# Patient Record
Sex: Male | Born: 1990 | Race: White | Hispanic: No | Marital: Single | Smoking: Current some day smoker
Health system: Southern US, Community
[De-identification: ages and names within clinical notes are randomized; demographics above are authoritative.]

## PROBLEM LIST (undated history)

## (undated) DIAGNOSIS — F32A Depression, unspecified: Secondary | ICD-10-CM

## (undated) DIAGNOSIS — K5792 Diverticulitis of intestine, part unspecified, without perforation or abscess without bleeding: Secondary | ICD-10-CM

## (undated) DIAGNOSIS — R4689 Other symptoms and signs involving appearance and behavior: Secondary | ICD-10-CM

## (undated) DIAGNOSIS — R4589 Other symptoms and signs involving emotional state: Secondary | ICD-10-CM

## (undated) DIAGNOSIS — F329 Major depressive disorder, single episode, unspecified: Secondary | ICD-10-CM

---
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
## 2016-12-29 ENCOUNTER — Encounter (HOSPITAL_COMMUNITY): Payer: Self-pay

## 2016-12-29 ENCOUNTER — Emergency Department (HOSPITAL_COMMUNITY)
Admission: EM | Admit: 2016-12-29 | Discharge: 2017-01-07 | Disposition: A | Discharge status: Home / Self Care | Payer: Self-pay | Attending: Emergency Medicine | Admitting: Emergency Medicine

## 2016-12-29 DIAGNOSIS — R45851 Suicidal ideations: Secondary | ICD-10-CM | POA: Insufficient documentation

## 2016-12-29 DIAGNOSIS — F329 Major depressive disorder, single episode, unspecified: Secondary | ICD-10-CM | POA: Insufficient documentation

## 2016-12-29 DIAGNOSIS — F17200 Nicotine dependence, unspecified, uncomplicated: Secondary | ICD-10-CM | POA: Insufficient documentation

## 2016-12-29 HISTORY — DX: Depression, unspecified: F32.A

## 2016-12-29 HISTORY — DX: Major depressive disorder, single episode, unspecified: F32.9

## 2016-12-29 HISTORY — DX: Other symptoms and signs involving appearance and behavior: R46.89

## 2016-12-29 HISTORY — DX: Other symptoms and signs involving emotional state: R45.89

## 2016-12-29 LAB — COMPREHENSIVE METABOLIC PANEL
ALT: 25 U/L (ref 17–63)
ANION GAP: 13 (ref 5–15)
AST: 23 U/L (ref 15–41)
Albumin: 4.4 g/dL (ref 3.5–5.0)
Alkaline Phosphatase: 88 U/L (ref 38–126)
BILIRUBIN TOTAL: 0.7 mg/dL (ref 0.3–1.2)
BUN: 10 mg/dL (ref 6–20)
CHLORIDE: 99 mmol/L — AB (ref 101–111)
CO2: 25 mmol/L (ref 22–32)
Calcium: 9.9 mg/dL (ref 8.9–10.3)
Creatinine, Ser: 0.85 mg/dL (ref 0.61–1.24)
Glucose, Bld: 88 mg/dL (ref 65–99)
POTASSIUM: 3.7 mmol/L (ref 3.5–5.1)
Sodium: 137 mmol/L (ref 135–145)
TOTAL PROTEIN: 8.3 g/dL — AB (ref 6.5–8.1)

## 2016-12-29 LAB — RAPID URINE DRUG SCREEN, HOSP PERFORMED
AMPHETAMINES: NOT DETECTED
BENZODIAZEPINES: NOT DETECTED
Barbiturates: NOT DETECTED
Cocaine: NOT DETECTED
Opiates: NOT DETECTED
TETRAHYDROCANNABINOL: NOT DETECTED

## 2016-12-29 LAB — CBC
HEMATOCRIT: 46.7 % (ref 39.0–52.0)
HEMOGLOBIN: 16.1 g/dL (ref 13.0–17.0)
MCH: 30.4 pg (ref 26.0–34.0)
MCHC: 34.5 g/dL (ref 30.0–36.0)
MCV: 88.1 fL (ref 78.0–100.0)
PLATELETS: 372 10*3/uL (ref 150–400)
RBC: 5.3 MIL/uL (ref 4.22–5.81)
RDW: 13.5 % (ref 11.5–15.5)
WBC: 12 10*3/uL — AB (ref 4.0–10.5)

## 2016-12-29 LAB — ACETAMINOPHEN LEVEL: Acetaminophen (Tylenol), Serum: <10 ug/mL — ABNORMAL LOW (ref 10–30)

## 2016-12-29 LAB — SALICYLATE LEVEL

## 2016-12-29 LAB — ETHANOL

## 2016-12-29 MED ORDER — ALUM & MAG HYDROXIDE-SIMETH 200-200-20 MG/5ML PO SUSP
30.0000 mL | Freq: Four times a day (QID) | ORAL | Status: DC | PRN
  Administered 2017-01-03 – 2017-01-05 (×2): 30 mL via ORAL
  Filled 2016-12-29 (×2): qty 30

## 2016-12-29 MED ORDER — ACETAMINOPHEN 325 MG PO TABS: 650.0000 mg | ORAL_TABLET | ORAL | Status: DC | PRN

## 2016-12-29 MED ORDER — ONDANSETRON HCL 4 MG PO TABS: 4.0000 mg | ORAL_TABLET | Freq: Three times a day (TID) | ORAL | Status: DC | PRN

## 2016-12-29 NOTE — ED Provider Notes (Signed)
[facility]-EMERGENCY DEPT Provider Note   CSN: [id number] Arrival date & time: 12/29/16  1529     History   Chief Complaint Chief Complaint  Patient presents with  . Suicidal    HPI Theresia Loyler XXXDavis is a 26 y.o. male.  26yo M w/ PMH including CP who p/w depression and SI. Pt reports a long hx of depression for which he has seen a counselor in the past but is not currently undergoing any treatment. He states the depression has been worse over the past 2 weeks. 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 He states that a few hours ago he became suicidal. He states " I have been working on a plan" but he refuses to tell me any details. He states he drank alcohol heavily yesterday but is not a daily drinker. He denies drug use. He reports homicidal thoughts towards his father.  Pt reports recent R lower tooth pain for which he completed a course of antibiotics 1 week ago. He has continued to have tooth pain. He has not followed up with a dentist.   The history is provided by the patient.    Past Medical History:  Diagnosis Date  . Depression     There are no active problems to display for this patient.   History reviewed. No pertinent surgical history.     Home Medications    Prior to Admission medications   Medication Sig Start Date End Date Taking? Authorizing Provider  ibuprofen (ADVIL,MOTRIN) 600 MG tablet Take 600 mg by mouth daily as needed. 12/08/16  Yes [provider]    Family History History reviewed. No pertinent family history.  Social History Social History  Substance Use Topics  . Smoking status: Current Some Day Smoker  . Smokeless tobacco: Never Used  . Alcohol use Yes     Allergies   Patient has no known allergies.   Review of Systems Review of Systems All other systems reviewed and are negative except that which was mentioned in HPI   Physical Exam Updated Vital Signs BP (!)  176/118 (BP Location: Right Arm)   Pulse (!) 107   Temp 98.6 F (37 C) (Oral)   Resp 18   Wt 101.7 kg (224 lb 1.6 oz)   SpO2 96%   Physical Exam  Constitutional: He is oriented to person, place, and time. He appears well-developed and well-nourished. No distress.  HENT:  Head: Normocephalic and atraumatic.  Mouth/Throat: Oropharynx is clear and moist.  Broken R lower molar with no drainage, gumline swelling, or facial swelling  Eyes: Conjunctivae are normal.  Neck: Neck supple.  Cardiovascular: Normal rate, regular rhythm and normal heart sounds.   No murmur heard. Pulmonary/Chest: Effort normal and breath sounds normal. No respiratory distress.  Neurological: He is alert and oriented to person, place, and time.  Skin: Skin is warm and dry.  Psychiatric:  Avoids eye contact, depressed mood, slightly angry during conversation  Nursing note and vitals reviewed.    ED Treatments / Results  Labs (all labs ordered are listed, but only abnormal results are displayed) Labs Reviewed  COMPREHENSIVE METABOLIC PANEL - Abnormal; Notable for the following:       Result Value   Chloride 99 (*)    Total Protein 8.3 (*)    All other components within normal limits  CBC - Abnormal; Notable for the following:    WBC 12.0 (*)    All other components  within normal limits  ETHANOL  SALICYLATE LEVEL  ACETAMINOPHEN LEVEL  RAPID URINE DRUG SCREEN, HOSP PERFORMED    EKG  EKG Interpretation None       Radiology No results found.  Procedures Procedures (including critical care time)  Medications Ordered in ED Medications  acetaminophen (TYLENOL) tablet 650 mg (not administered)  ondansetron (ZOFRAN) tablet 4 mg (not administered)  alum & mag hydroxide-simeth (MAALOX/MYLANTA) 200-200-20 MG/5ML suspension 30 mL (not administered)     Initial Impression / Assessment and Plan / ED Course  I have reviewed the triage vital signs and the nursing notes.  Pertinent labs that were  available during my care of the patient were reviewed by me and considered in my medical decision making (see chart for details).     PT w/ h/o depression p/w worsening depression and SI today. He was hypertensive but otherwise stable VS on exam. He would not tell me his plan but states he has been working on one. I examined tooth where he has cavity and partially broken molar. He does not have any surrounding swelling and given that he just completed antibiotics I do not feel he needs any further antibiotics at this time, rather he needs to see a dentist as soon as possible to have this definitively managed. I have discussed this with him. Contacted TTS as I am concerned about his worsening depression.  6:16 PM Pt's labs unremarkable. Pt medically clear for psychiatric evaluation. His disposition will be determined by psychiatry team recommendations. He is currently here voluntarily but given his significant threats for self-harm, I will complete IVC papers if he tries to leave. Final Clinical Impressions(s) / ED Diagnoses   Final diagnoses:  None    New Prescriptions New Prescriptions   No medications on file     Skarleth Delmonico, Ambrose Finland, MD 12/29/16 1932
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
## 2016-12-29 NOTE — ED Notes (Signed)
TTS in progress 
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
## 2016-12-29 NOTE — ED Notes (Signed)
Dinner tray ordered.

## 2016-12-29 NOTE — ED Notes (Signed)
Pt ambulatory to restroom, pt instructed he must leave door cracked

## 2016-12-29 NOTE — ED Notes (Signed)
Writer was called by the NT sitter to help with the patient, writer observed pt having a spoon in his hands with sitter holding on to patients hand and spoon.  Sitter stated that he was trying to put the spoon down his throat to hurt himself.  Radio broadcast assistantWriter assisted sitter with removing the spoon from patient.  Teacher, early years/preWriter and sitter removed table, cups, and utensils from pt's room.  Environmental managerWriter notified RN.

## 2016-12-29 NOTE — ED Notes (Signed)
Pt states he has a very serious plan to harm himself. Also states, "I'm not telling anyone". EDP aware.

## 2016-12-29 NOTE — ED Notes (Signed)
Pt is calm and cooperative with this RN. Ensured belongings in [place] and valuables with security. Pt also signed "what to expect sheet" pt resting comfortably in bed, NAD at this time. Denies any needs.

## 2016-12-29 NOTE — ED Notes (Signed)
TTS at bedside. 

## 2016-12-29 NOTE — ED Notes (Signed)
Pt making phone call at nurses station 

## 2016-12-29 NOTE — ED Triage Notes (Addendum)
Pt state he "wants to die." Pt states he has had problems with his family and he "feels like no one wants him around." Pt states heavy alcohol use yesterday. Pt also states he has homicidal ideation towards his father.  Pt denies drug use X2 months. He is alert and oriented, cooperative during triage.

## 2016-12-29 NOTE — ED Notes (Addendum)
Pt shredding paper, noted to be chewing on something, when asked to open his mouth, pt swallowed, nothing in mouth at this time. EDP aware. Security and [employer] at the bedside.

## 2016-12-29 NOTE — ED Notes (Signed)
This tech went in triage room to place all of pt belongings into white belongings bag when pt was missing a shoelace on one shoe but not the other. I asked pt if he knew where the other shoelace was he said "no i didn't know where it went when I left the house I had them both on". This tech then noticed the shoelace in pt hands, this tech asked pt repeatedly  to give the shoelace to this tech he said "no this is the only thing I have to kill myself with, you will have to pry it out of my hand" I told pt that he needed to give the shoelace up that we would need to call security if he didn't, pt then said "well call them cause im not letting go of it." Security was call and pt handed shoelace over to security. Shoelace was then put in pt belongings bag.

## 2016-12-29 NOTE — ED Notes (Signed)
Pt belongings are in [place] in locker #1. Pt also has valuables that were given to security.

## 2016-12-30 MED ORDER — HALOPERIDOL LACTATE 5 MG/ML IJ SOLN
5.0000 mg | Freq: Once | INTRAMUSCULAR | Status: AC
  Administered 2016-12-30: 5 mg via INTRAMUSCULAR
  Filled 2016-12-30: qty 1

## 2016-12-30 MED ORDER — LORAZEPAM 2 MG/ML IJ SOLN
1.0000 mg | Freq: Once | INTRAMUSCULAR | Status: AC
  Administered 2016-12-30: 1 mg via INTRAMUSCULAR
  Filled 2016-12-30: qty 1

## 2016-12-30 MED ORDER — DIPHENHYDRAMINE HCL 50 MG/ML IJ SOLN
25.0000 mg | Freq: Once | INTRAMUSCULAR | Status: AC
  Administered 2016-12-30: 25 mg via INTRAMUSCULAR
  Filled 2016-12-30: qty 1

## 2016-12-30 NOTE — ED Triage Notes (Addendum)
Pt is sitting on floor and reported he had a plan to hit his head against the wall and it would only  Take 30 sec.  Pt did not finish  the sentence.

## 2016-12-30 NOTE — ED Triage Notes (Signed)
Called to PT room by NT Sitter. Pt had had around his neck trying to choke self.

## 2016-12-30 NOTE — ED Notes (Signed)
Pt care assumed, pt is resting comfortably with eyes closed.  Rise and fall of chest noted.  Sitter at bedside.

## 2016-12-30 NOTE — ED Triage Notes (Signed)
PT grabbed spoon and broke spoon and reported he was going to use it.

## 2016-12-30 NOTE — ED Triage Notes (Signed)
TC to DR Erma HeritageIsaacs  To request medication to help pt relax.

## 2016-12-30 NOTE — ED Triage Notes (Signed)
Pt to BR  With sitter escort . Pt in direct sight of sitter while in BR.

## 2016-12-30 NOTE — ED Triage Notes (Signed)
PT sitting up in bed eating meal. Pt reported he felt better after he slept.

## 2016-12-30 NOTE — ED Triage Notes (Signed)
NT reported the Pt told her he was getting ready to hang himself with the call bell cord. Plan to remove the call bell from room. Pt also reported he will hang himself if he takes a shower .

## 2016-12-30 NOTE — ED Provider Notes (Signed)
Patient attempting to harm himself via broken utensils, which security has confiscated. Pt continues to try to harm himself, and remains actively suicidal. Given high risk of danger to himself with refusal to allow security to search him with active attempts to harm self, IVC placed. Nursing aware.   Shaune PollackIsaacs, Royanne Warshaw, MD 12/30/16 [phone]

## 2016-12-30 NOTE — ED Notes (Signed)
Regular Diet Ordered for Dinner. 

## 2016-12-30 NOTE — ED Triage Notes (Signed)
Pt has a 24 HR hold ,Emergency Commitment  Until  IVC papers arrive.

## 2016-12-30 NOTE — BH Assessment (Signed)
This clinician evaluated the patient who reports he just tried to kill himself with a broken plastic spoon to his knife. When asked how this would harm him he stated he would "dig deep enough." According to the patient's nurse has made two verbal threats today: to choke self with a call bell cord or would find something in the shower to hang self.  The patient did attempt to put a spoon down his throat and bang his head on the wall. The patient has now been IVC'd. Continues to endorse SI and HI. States he is homicidal toward his father would not offer any information about his father. Reluctantly admitted his mother, Adele SchilderDiana Gural, is his emergency contact.    The patient was raised by his mother. Report only having his father in his life for 10 yrs. State his father chose alcohol over him so was not engaged for a long time. States he has been told he was a mistake his whole life. Was living with Grandmother most recently but indicated as of yesterday he is now homeless. "She doesn't want me anymore."  Reports finishing the 12th grade. Denies DD or IEP assistance in school.   Patient repeatedly stated he didn't want anyone to know he was at the hospital. Admittedly came to the ER voluntarily seeking help. Now states, "I will jump out of the car if someone tries to take me somewhere." This clinician assured the patient authorities would be made aware in order to take precautions. Patient understands he is IVC'd.    The patient continues to meet inpatient criteria.

## 2016-12-30 NOTE — ED Triage Notes (Signed)
PT refuses to return to bed.

## 2016-12-30 NOTE — Progress Notes (Signed)
Patient meets criteria for inpatient treatment. CSW faxed referrals to the following inpatient facilities for review:  [facility], [facility], [facility], [facility], [facility], [facility], [facility], [address] [facility] [facility], [facility], [facility], [facility]   TTS will continue to seek bed placement.  Baldo DaubJolan Zania Kalisz MSW, LCSWA CSW Disposition [phone]

## 2016-12-31 MED ORDER — IBUPROFEN 400 MG PO TABS
600.0000 mg | ORAL_TABLET | Freq: Three times a day (TID) | ORAL | Status: DC | PRN
Start: 2016-12-31 — End: 2017-01-07
  Administered 2016-12-31 – 2017-01-03 (×5): 600 mg via ORAL
  Filled 2016-12-31 (×5): qty 1

## 2016-12-31 NOTE — BH Assessment (Signed)
BHH Assessment Progress Note  Pt reassessed this morning. Pt continues to endorse SI and HI (towards his dad). Pt reports never having an IP psych hospitalization nor ever taking any psychiatric meds. Pt reports feeling suicidal since he was 26 years old. Pt reports being currently homeless, last living with his grandmother for a couple of days outside of [place]. Before this, pt reports living in [place] for a couple of days. Prior to this, pt reports living in [place] for over a year with his fiance until she broke up with him. IP treatment still recommended for pt.    Johny ShockSamantha M. Ladona Ridgelaylor, MS, NCC, LPCA Counselor

## 2016-12-31 NOTE — ED Notes (Signed)
Pt provider evening snack.

## 2016-12-31 NOTE — ED Notes (Signed)
Call bell noted to be removed from room. Per report, pt is to have no silverware.

## 2016-12-31 NOTE — ED Notes (Signed)
Pt ambulatory to shower w/Sitter. Pt voices

## 2016-12-31 NOTE — ED Notes (Signed)
Regular Diet was ordered for Dinner. 

## 2016-12-31 NOTE — Progress Notes (Signed)
CSW spoke with David BasqueBecky, RN who has requested NP make medication recommendations for the pt or review the medication. David BasqueBecky, RN reports the pt verbalized to her today that he does feel better but he is still suicidal. Debbora PrestoJustina, NP notified of the request.  Princess BruinsAquicha Dadrian Ballantine, MSW, LCSWA TTS Specialist [phone]

## 2016-12-31 NOTE — ED Notes (Signed)
Pt asking when he will be accepted to a facility. Discussed w/pt how his behavior affects this - advised pt if he is calm, cooperative - RN's can document this which will help w/hopefully get accepted sooner. States he is aware of how he acted yesterday and states he will not display that behavior again. States he wants to go to a facility to get help. Pt has had call bell today w/o difficulty/issues. Pt also showered today w/o issues. Pt has been calm, cooperative, pleasant - apologized to staff who were here during his episode.

## 2016-12-31 NOTE — ED Notes (Signed)
Patient made a phone call.  

## 2016-12-31 NOTE — ED Notes (Signed)
Pt given call bell as asked after much discussion of appropriate use. Pt verbalized understanding and advised he will not use it in an attempt to hurt himself. Sitter to monitor pt closely.

## 2016-12-31 NOTE — ED Notes (Signed)
Patient was given cookies for snack.

## 2016-12-31 NOTE — ED Notes (Signed)
Pt up to bathroom.

## 2017-01-01 MED ORDER — ZOLPIDEM TARTRATE 5 MG PO TABS
5.0000 mg | ORAL_TABLET | Freq: Every evening | ORAL | Status: DC | PRN
  Administered 2017-01-01 – 2017-01-06 (×7): 5 mg via ORAL
  Filled 2017-01-01 (×7): qty 1

## 2017-01-01 NOTE — ED Notes (Signed)
Spoke with Bebe ShaggyWickline, MD about patient concern of difficulty falling asleep.

## 2017-01-01 NOTE — ED Notes (Signed)
MD notified about pt cp.

## 2017-01-01 NOTE — ED Notes (Signed)
PT ambulated to the bathroom, pt asking about pain medication and meds that were given last night to help him sleep.

## 2017-01-01 NOTE — BHH Counselor (Signed)
Reassessment- Pt reports SI/HI. Pt states he wants to harm his father. Pt reports AH.  Renata Capriceonrad, DNP continues to recommend inpatient.  Wolfgang PhoenixBrandi Alvine Mostafa, [facility] Triage Specialist

## 2017-01-01 NOTE — ED Notes (Signed)
Pt states that he feel numbness of the right side of his body. That he us unable to lift his arm or leg. But pt is now feeling better and able to move all extremeties at this time states he feel better and is able to walk with a steady gait to the bathroom and doesn't have any numbness or tingling.

## 2017-01-01 NOTE — ED Notes (Signed)
Pt ambulated to shower with sitter.  

## 2017-01-01 NOTE — ED Notes (Signed)
Sitting up in bed eating breakfast

## 2017-01-01 NOTE — ED Notes (Signed)
Pt c/o left knee pain from inactivity and old injuries-orders received -- knee sleeve placed on left knee.

## 2017-01-01 NOTE — ED Notes (Signed)
Pt stated that he was not going to do any acting out. Stated that he is only here to get the help that he needs.

## 2017-01-01 NOTE — ED Notes (Addendum)
Pt called out. States that he is unable to sleep here. Request something to help him sleep. MD contacted

## 2017-01-01 NOTE — Progress Notes (Signed)
CSW spoke with David Lamb at [facility] who states the pt's referral has not yet been reviewed. CSW will continue to monitor.  [name], MSW, LCSWA CSW Disposition [phone]

## 2017-01-01 NOTE — Progress Notes (Signed)
CSW spoke with Arline Aspindy at [facility] who states they do not have any adult beds at this time.  [facility] - Terri Burges, no answer, HIPPA compliant voicemail left with intake coordinator regarding bed status.  [facility] - Chelsea - states she does not have the referral but reports they are accepting patients and has 6 available beds at this time. CSW to fax referral to [facility].   Princess BruinsAquicha Daelan Gatt, MSW, LCSWA CSW Disposition [phone]

## 2017-01-02 MED ORDER — HALOPERIDOL 5 MG PO TABS: 5.0000 mg | ORAL_TABLET | Freq: Four times a day (QID) | ORAL | Status: DC | PRN

## 2017-01-02 MED ORDER — HALOPERIDOL LACTATE 5 MG/ML IJ SOLN: 5.0000 mg | Freq: Four times a day (QID) | INTRAMUSCULAR | Status: DC | PRN

## 2017-01-02 MED ORDER — HYDROXYZINE HCL 25 MG PO TABS
25.0000 mg | ORAL_TABLET | Freq: Four times a day (QID) | ORAL | Status: DC | PRN
  Administered 2017-01-03: 25 mg via ORAL
  Filled 2017-01-02: qty 1

## 2017-01-02 MED ORDER — LORAZEPAM 2 MG/ML IJ SOLN: 2.0000 mg | Freq: Four times a day (QID) | INTRAMUSCULAR | Status: DC | PRN

## 2017-01-02 MED ORDER — DIPHENHYDRAMINE HCL 50 MG/ML IJ SOLN: 25.0000 mg | Freq: Four times a day (QID) | INTRAMUSCULAR | Status: DC | PRN

## 2017-01-02 MED ORDER — LORAZEPAM 1 MG PO TABS: 2.0000 mg | ORAL_TABLET | Freq: Four times a day (QID) | ORAL | Status: DC | PRN

## 2017-01-02 MED ORDER — DIPHENHYDRAMINE HCL 25 MG PO CAPS
25.0000 mg | ORAL_CAPSULE | Freq: Four times a day (QID) | ORAL | Status: DC | PRN
  Administered 2017-01-03 – 2017-01-06 (×4): 25 mg via ORAL
  Filled 2017-01-02 (×4): qty 1

## 2017-01-02 NOTE — ED Notes (Signed)
Pt to desk to use phone, requesting the sheet of paper with phone numbers on it (it is on the clipboard).  Pt had short, pleasant conversation with his "bro," and informed him of the visiting hours and policies and that he still didn't know where he would be going.

## 2017-01-02 NOTE — ED Notes (Signed)
Patient requested that sitter wake him up at 0600 so he could "listen to music from 6-7 because it relaxes" him.  Patient now awake.

## 2017-01-02 NOTE — BH Assessment (Signed)
BHH Assessment Progress Note  Pt reassessed this afternoon. Pt continues to endorse SI, HI, and AVH. IP treatment continues to be recommended.    Johny ShockSamantha M. Ladona Ridgelaylor, MS, NCC, LPCA Counselor

## 2017-01-02 NOTE — ED Notes (Signed)
Pt given snack by sitter.  °

## 2017-01-02 NOTE — ED Notes (Signed)
Md seen pt stated that no further testing needed to be done.

## 2017-01-02 NOTE — ED Notes (Signed)
Patient is asleep at this time, NAD noted.

## 2017-01-02 NOTE — ED Notes (Signed)
Dinner tray ordered.

## 2017-01-02 NOTE — ED Notes (Signed)
Pt up to BR with even steady gait.

## 2017-01-03 ENCOUNTER — Other Ambulatory Visit: Payer: Self-pay | Admitting: Family

## 2017-01-03 NOTE — ED Notes (Signed)
Pt. oob to the front desk to make a phone call.  A paper with his numbers given to him.  Pt. Made a call but did not speak to anyone.  Pt.l was doing push-ups in his room.

## 2017-01-03 NOTE — ED Notes (Signed)
Pt was getting restless so this RN and Gena Fray walked Pt around the ED to stretch out legs.
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
## 2017-01-03 NOTE — ED Notes (Signed)
Pt states, "I will kill myself right outside the door if I get discharged. I will grab something and cut my neck. I will do it in the lobby in front of everybody if I have to. I don't want to go to anyplace in [place] either because my dad is the type of person that he will come and get to me if he has to no  Matter what he has to go through." this Rn encouraged the pt to remain calm and this RN will update on plan of care

## 2017-01-03 NOTE — Progress Notes (Signed)
Patient is being reviewed at [facility], per [place]. CSW in disposition will continue to follow up in the morning and seek inpatient treatment placement.  Melbourne Abts, MSW, LCSWA Clinical social worker in disposition [facility], TTS Office [phone] and [phone] 01/03/2017 8:27 PM

## 2017-01-03 NOTE — ED Notes (Signed)
Pt c/o of indigestion.

## 2017-01-04 MED ORDER — SERTRALINE HCL 50 MG PO TABS
25.0000 mg | ORAL_TABLET | Freq: Every day | ORAL | Status: DC
  Administered 2017-01-04 – 2017-01-07 (×4): 25 mg via ORAL
  Filled 2017-01-04 (×4): qty 1

## 2017-01-04 NOTE — ED Notes (Signed)
Pt called RN to room  - asking for [place] sandwich - advised pt he may have one at 1000 snack time - voiced understanding. Advised pt he may ask his Sitter his requests or ask him/her to notify RN. Voiced understanding.

## 2017-01-04 NOTE — ED Notes (Signed)
Re-TTS performed.  

## 2017-01-04 NOTE — ED Notes (Signed)
Pt threw spoon away - showed RN. Pt used spoon appropriately.

## 2017-01-04 NOTE — Plan of Care (Cosign Needed)
Consider initiating Zoloft for mood stabilization.  See consult notes for medication recommendation

## 2017-01-04 NOTE — ED Notes (Signed)
Pt voices upset d/t friend did not visit today as he had advised him he would do. Allowed pt to vent concerns/feelings.

## 2017-01-04 NOTE — ED Notes (Signed)
Pt ambulatory to nurses' desk asking if he is supposed to feel Zoloft helping him yet. Advised takes a couple of weeks for him to feel better. Voiced understanding.

## 2017-01-04 NOTE — ED Notes (Signed)
Pt attempted to make phone call - no answer. Pt returned to his room and is singing to country music videos.

## 2017-01-04 NOTE — ED Notes (Signed)
[place] sandwich and cranberry juice given for snack per pt request.

## 2017-01-04 NOTE — BH Assessment (Signed)
Patient reassessed on this date, 01-04-2017. Patient reports sleep disturbance the previous night of waking up intermittenly between 1700 and 0600.  He reports current SI with a plan to hang or cut himself.  Patient reports current HI with a plan to cut the throat of his Father who lives in [place], however he does not know the address.  Patient reports being estranged from all immediate family members who live in [place].  Patient rates depression level a "10" on a 10 point ascending scale.  He reports feeling "sad", "wanting to sleep all the time", "over eating", and "feeling tired all the time."  Patient reports denied experiencing VH, however reports an illusion of an "24 year old woman coming into my room and sitting by my bed" with no interaction or fear of her.    Patient continues to meet inpatient criteria and placement continue to be sought.

## 2017-01-04 NOTE — ED Notes (Signed)
Pt has a spoon so may eat ice cream - verbalized understanding of proper use - states "I'm not going to try to hurt myself again. I learned my lesson.

## 2017-01-04 NOTE — ED Notes (Signed)
Pt ambulating in hallway in [place] w/Sitter.

## 2017-01-04 NOTE — ED Notes (Signed)
Dinner Tray Ordered 

## 2017-01-04 NOTE — Progress Notes (Addendum)
Patient has been recommended inpatient treatment on 8/18 and has been referred out to the following hospitals: [facility] - per Tinnie Gens, no beds today. [facility] - this Clinical research associate left voicemail inquiring about status of referral. [facility] - per Silva Bandy, refax referral. Referral has been refaxed today. [facility] - per Aurea Graff, please call back on Monday when they have discharges. Referral was not found.  Declined at: [facility] - per [place].  At capacity: [facility], [facility], [facility] [place], [facility], [facility], [facility].  This Clinical research associate referred patient to [facility]. Authorization with [place], per licensed clinician [place]: [place] [id number] effective 01/04/17 through 01/10/17. Demographics have been given.  CSW in disposition will continue to follow up in efforts to secure placement for patient.  Melbourne Abts, MSW, LCSWA Clinical social worker in disposition [facility], TTS Office [phone] and [phone] 01/04/2017 12:29 PM

## 2017-01-05 NOTE — ED Notes (Signed)
Pt being monitored by RN.

## 2017-01-05 NOTE — ED Notes (Signed)
Standing at nurses' desk talking w/staff.
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
## 2017-01-05 NOTE — ED Notes (Signed)
Pt returned cards, pencil, and paper to RN. States he became bored w/attempt to write his songs.

## 2017-01-05 NOTE — ED Notes (Signed)
Sandwich and Sprite given for snack as requested.

## 2017-01-05 NOTE — ED Provider Notes (Signed)
Patient continues to endorse ongoing thoughts of suicide and homicidal thoughts. He states "this is not going to go away until I get treatment". Patient is alert and oriented. He is cooperative. No acute distress. Heart and lungs normal. Lower extremities normal without peripheral edema swelling or erythema. All movements are coordinated purposeful symmetric. Skin is warm and dry. IVC paperwork renewed for ongoing plan of placement for ongoing homicidal and suicidal thoughts.   Arby Barrette, MD 01/05/17 [phone]

## 2017-01-05 NOTE — Progress Notes (Signed)
Patient is on the [facility] waitlist, per intake [place].  Melbourne Abts, MSW, LCSWA Clinical social worker in disposition [facility], TTS Office [phone] and [phone]

## 2017-01-05 NOTE — ED Notes (Signed)
Magistrate received pt's IVC papers - will send officer to serve.

## 2017-01-05 NOTE — ED Notes (Signed)
Pt c/o indigestion - Maalox given.

## 2017-01-05 NOTE — ED Notes (Signed)
Pt given playing cards and pencil and paper so may write songs. Pt voiced understanding of appropriate use and states he will not attempt to hurt himself w/them.

## 2017-01-05 NOTE — ED Notes (Signed)
Pt showed RN he threw spoon away at breakfast and lunch - pt has been using spoon appropriately.

## 2017-01-05 NOTE — [facility] Counselor (Signed)
Client presented to [facility] on 12/29/16 with complaint of suicidal ideation and depressive symptoms as well as alcohol use.  Pt was re-assessed this morning.  He continues to endorse suicidal ideation with plan.  Recommend continued inpatient placement.

## 2017-01-05 NOTE — ED Notes (Signed)
Dr Donnald Garre assessed pt - renewing IVC papers.

## 2017-01-05 NOTE — ED Notes (Signed)
Pt ambulatory to shower w/Sitter. 

## 2017-01-05 NOTE — ED Notes (Signed)
Renewed IVC papers served - copy faxed to [facility], copy sent to Medical Records, original placed in folder for Magistrate, all 4 sets on clipboard.

## 2017-01-06 NOTE — ED Notes (Signed)
Pt states that he feels better. States he is having less thoughts of harming other but stills endorses suicidal ideations. Pt agreed to request talking with staff if feelings become worst. Door open, sitter at bedside. Pt cooperative.

## 2017-01-06 NOTE — ED Notes (Signed)
Pt up to bathroom.

## 2017-01-06 NOTE — ED Notes (Signed)
Breakfast tray ordered 
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
## 2017-01-06 NOTE — BH Assessment (Signed)
This clinician spoke with the patient who reports he will remain suicidal until he gets the help he needs. Although, he does report he will not jump out of the car when transported as he did threatened several days ago. "I will not do that." He remain HI toward his father but indicated he doesn't want to act on it right now. The patient feels he is less stress but he might act out on it if was discharged. He reports feeling calmer than when he was admitted. States he's taking medication.   The patient continues to meet criteria for inpatient admission

## 2017-01-07 ENCOUNTER — Inpatient Hospital Stay (HOSPITAL_COMMUNITY)
Admission: AD | Admit: 2017-01-07 | Discharge: 2017-01-14 | DRG: 885 | Disposition: A | Discharge status: Home / Self Care | Payer: Federal, State, Local not specified - Other | Attending: Psychiatry | Admitting: Psychiatry

## 2017-01-07 ENCOUNTER — Encounter (HOSPITAL_COMMUNITY): Payer: Self-pay | Admitting: *Deleted

## 2017-01-07 DIAGNOSIS — Z560 Unemployment, unspecified: Secondary | ICD-10-CM | POA: Diagnosis not present

## 2017-01-07 DIAGNOSIS — F603 Borderline personality disorder: Secondary | ICD-10-CM | POA: Diagnosis present

## 2017-01-07 DIAGNOSIS — F17200 Nicotine dependence, unspecified, uncomplicated: Secondary | ICD-10-CM | POA: Diagnosis present

## 2017-01-07 DIAGNOSIS — Y92239 Unspecified place in hospital as the place of occurrence of the external cause: Secondary | ICD-10-CM | POA: Diagnosis present

## 2017-01-07 DIAGNOSIS — Z915 Personal history of self-harm: Secondary | ICD-10-CM | POA: Diagnosis not present

## 2017-01-07 DIAGNOSIS — Z590 Homelessness: Secondary | ICD-10-CM | POA: Diagnosis not present

## 2017-01-07 DIAGNOSIS — S60221A Contusion of right hand, initial encounter: Secondary | ICD-10-CM | POA: Diagnosis present

## 2017-01-07 DIAGNOSIS — Z8249 Family history of ischemic heart disease and other diseases of the circulatory system: Secondary | ICD-10-CM

## 2017-01-07 DIAGNOSIS — Z818 Family history of other mental and behavioral disorders: Secondary | ICD-10-CM | POA: Diagnosis not present

## 2017-01-07 DIAGNOSIS — W2209XA Striking against other stationary object, initial encounter: Secondary | ICD-10-CM | POA: Diagnosis present

## 2017-01-07 DIAGNOSIS — F17210 Nicotine dependence, cigarettes, uncomplicated: Secondary | ICD-10-CM | POA: Diagnosis not present

## 2017-01-07 DIAGNOSIS — R45851 Suicidal ideations: Secondary | ICD-10-CM | POA: Diagnosis present

## 2017-01-07 DIAGNOSIS — R4584 Anhedonia: Secondary | ICD-10-CM | POA: Diagnosis not present

## 2017-01-07 DIAGNOSIS — F322 Major depressive disorder, single episode, severe without psychotic features: Secondary | ICD-10-CM | POA: Diagnosis not present

## 2017-01-07 DIAGNOSIS — F333 Major depressive disorder, recurrent, severe with psychotic symptoms: Secondary | ICD-10-CM | POA: Diagnosis present

## 2017-01-07 MED ORDER — TRAZODONE HCL 50 MG PO TABS
50.0000 mg | ORAL_TABLET | Freq: Every evening | ORAL | Status: DC | PRN
  Administered 2017-01-07: 50 mg via ORAL
  Filled 2017-01-07: qty 1

## 2017-01-07 MED ORDER — ALUM & MAG HYDROXIDE-SIMETH 200-200-20 MG/5ML PO SUSP
30.0000 mL | ORAL | Status: DC | PRN
  Administered 2017-01-08: 30 mL via ORAL
  Filled 2017-01-07: qty 30

## 2017-01-07 MED ORDER — SERTRALINE HCL 25 MG PO TABS
25.0000 mg | ORAL_TABLET | Freq: Every day | ORAL | Status: DC
  Administered 2017-01-08: 25 mg via ORAL
  Filled 2017-01-07 (×2): qty 1

## 2017-01-07 MED ORDER — IBUPROFEN 600 MG PO TABS
600.0000 mg | ORAL_TABLET | Freq: Three times a day (TID) | ORAL | Status: DC | PRN
  Administered 2017-01-07 – 2017-01-14 (×11): 600 mg via ORAL
  Filled 2017-01-07 (×11): qty 1

## 2017-01-07 MED ORDER — MAGNESIUM HYDROXIDE 400 MG/5ML PO SUSP: 30.0000 mL | Freq: Every day | ORAL | Status: DC | PRN

## 2017-01-07 MED ORDER — HYDROXYZINE HCL 25 MG PO TABS
25.0000 mg | ORAL_TABLET | Freq: Four times a day (QID) | ORAL | Status: DC | PRN
  Administered 2017-01-08 – 2017-01-14 (×4): 25 mg via ORAL
  Filled 2017-01-07: qty 1
  Filled 2017-01-07: qty 10
  Filled 2017-01-07 (×4): qty 1

## 2017-01-07 NOTE — ED Notes (Signed)
Pt asking to shower - advised may shower later d/t waiting list. Voiced understanding.

## 2017-01-07 NOTE — ED Notes (Signed)
Spoke w/pt re: tx plan - accepted to [facility] - 404-1. Voiced understanding and agreement w/plan.

## 2017-01-07 NOTE — ED Notes (Signed)
Pt cooperative. Slept through the night. Pt ambulated to the restroom.

## 2017-01-07 NOTE — Progress Notes (Signed)
Admission Note:  26 year old male who presents IVC, in no acute distress, for the treatment of SI and HI. Patient appears anxious on admission. Patient was cooperative with admission process. Patient endorses SI with multiple plans to include "Cutting myself, hanging myself, strangling myself, choking myself, jumping off a bridge, jumping in front of a car/train".  Patient reports that he had plan to cut himself with a spoon in the Emergency Department but "someone was always watching me".  Patient contracts for safety while on the unit and states "I've been good for a week. I've had opportunities to do something to myself and didn't".  Patient denies AVH and states "I have a sixth sense though. I hear and see the paranormal". Patient reports HI towards father and states "I have a plan of cutting his throat wide open".  Patient identifies family conflict as his main stressors.  Patient states "my family don't accept me as a family member. They don't want me in their life. Grandma kicked me out on the 12th of August.  Uncle calls and cusses me out. I've been called a failure in high school".  Patient reports that he is currently homeless and is unable to identify a support system.  While at [facility], patient would like to "get help" and "Better myself".  Skin was assessed.  Patient had small bruises to arms bilateral and right leg.  Patient searched and no contraband found, POC and unit policies explained and understanding verbalized. Consents obtained. Patient had no additional questions or concerns.

## 2017-01-07 NOTE — Tx Team (Signed)
Initial Treatment Plan 01/07/2017 6:26 PM David Lamb MAU:[id number]    PATIENT STRESSORS: Loss of relationship Marital or family conflict   PATIENT STRENGTHS: Communication skills Motivation for treatment/growth Physical Health   PATIENT IDENTIFIED PROBLEMS: At risk for suicide  Other's directed violence  "Get help"  "Better Myself"               DISCHARGE CRITERIA:  Ability to meet basic life and health needs Improved stabilization in mood, thinking, and/or behavior Motivation to continue treatment in a less acute level of care Need for constant or close observation no longer present Verbal commitment to aftercare and medication compliance  PRELIMINARY DISCHARGE PLAN: Outpatient therapy Placement in alternative living arrangements  PATIENT/FAMILY INVOLVEMENT: This treatment plan has been presented to and reviewed with the patient, David Lamb.  The patient and family have been given the opportunity to ask questions and make suggestions.  Carleene Overlie, RN 01/07/2017, 6:26 PM

## 2017-01-07 NOTE — Progress Notes (Signed)
Pt in room at shift change.  Interaction with pt reveals that he is concerned that his parents will find out where he is and come get him.  Pt sts his dad does not want anything to do with him.  Pt sts his main trigger is his relations ship with family.  Pt sts "I have been suicidal since I was 26 years old"  Pt concentrates on past failures including break up with his girlfriend and less the honorable discharge from the [facility] after being in for 2 months.  Pt redirected to future goals and successes.  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  Pt sts he feels SI but will contract for safety, verbally.  Pt has HI towards his dad with hopes of cutting his throat.  Pt denies AVH.   Pt does claim to be able to communicate on a paranormal level with a sixth sense.   Pt offered support and encouragement on 1:1 and 15 min checks for safety. Pt remains safe on unit.

## 2017-01-07 NOTE — ED Notes (Signed)
Pt request midnight vitals be taken earlier. Reports having difficulty sleeping and does not want to be awakened once he falls asleep.

## 2017-01-07 NOTE — ED Notes (Signed)
Gave patient a turkey sandwich 

## 2017-01-07 NOTE — Progress Notes (Signed)
Adult Psychoeducational Group Note  Date:  01/07/2017 Time:  10:43 PM  Group Topic/Focus:  Wrap-Up Group:   The focus of this group is to help patients review their daily goal of treatment and discuss progress on daily workbooks.  Participation Level:  Minimal  Participation Quality:  Appropriate  Affect:  Appropriate and Depressed  Cognitive:  Appropriate  Insight: Appropriate, Lacking and Limited  Engagement in Group:  Lacking and Limited  Modes of Intervention:  Discussion  Additional Comments:  Pt stated his goal for today was to adjust to his new placement here. Pt stated his adjustment is going well for the most part. Pt rated his overall day a 4. Pt stated tomorrow goal is interact more with peers.  Felipa Furnace 01/07/2017, 10:43 PM

## 2017-01-07 NOTE — Progress Notes (Signed)
Per Malva Limes, [facility], patient has been accepted to [facility], bed 404-1 ; Accepting provider is Ferne Reus, NP; Attending provider is Dr. Jama Flavors.   [facility] bed is currently occupied, [facility] awaiting anticipated discharge.   Malva Limes, [facility] will notify patient's RN once bed is available and a time when the patient can transport to [facility]    Number for report is [phone].   Jerrol Banana, RN notified.   Baldo Daub MSW, LCSWA CSW Disposition [phone]

## 2017-01-08 ENCOUNTER — Encounter (HOSPITAL_COMMUNITY): Payer: Self-pay | Admitting: Psychiatry

## 2017-01-08 DIAGNOSIS — R45851 Suicidal ideations: Secondary | ICD-10-CM

## 2017-01-08 DIAGNOSIS — F1910 Other psychoactive substance abuse, uncomplicated: Secondary | ICD-10-CM

## 2017-01-08 DIAGNOSIS — F17210 Nicotine dependence, cigarettes, uncomplicated: Secondary | ICD-10-CM

## 2017-01-08 DIAGNOSIS — R4584 Anhedonia: Secondary | ICD-10-CM

## 2017-01-08 DIAGNOSIS — F322 Major depressive disorder, single episode, severe without psychotic features: Principal | ICD-10-CM

## 2017-01-08 DIAGNOSIS — Z62810 Personal history of physical and sexual abuse in childhood: Secondary | ICD-10-CM

## 2017-01-08 DIAGNOSIS — G4700 Insomnia, unspecified: Secondary | ICD-10-CM

## 2017-01-08 DIAGNOSIS — Z560 Unemployment, unspecified: Secondary | ICD-10-CM

## 2017-01-08 DIAGNOSIS — F603 Borderline personality disorder: Secondary | ICD-10-CM | POA: Clinically undetermined

## 2017-01-08 MED ORDER — SERTRALINE HCL 50 MG PO TABS
50.0000 mg | ORAL_TABLET | Freq: Every day | ORAL | Status: DC
  Administered 2017-01-09 – 2017-01-14 (×6): 50 mg via ORAL
  Filled 2017-01-08 (×2): qty 1
  Filled 2017-01-08: qty 7
  Filled 2017-01-08 (×6): qty 1

## 2017-01-08 MED ORDER — TRAZODONE HCL 100 MG PO TABS: 100.0000 mg | ORAL_TABLET | Freq: Every evening | ORAL | Status: DC | PRN

## 2017-01-08 MED ORDER — OLANZAPINE 5 MG PO TBDP
5.0000 mg | ORAL_TABLET | Freq: Three times a day (TID) | ORAL | Status: DC | PRN
  Administered 2017-01-11 – 2017-01-14 (×4): 5 mg via ORAL
  Filled 2017-01-08 (×4): qty 1

## 2017-01-08 MED ORDER — OLANZAPINE 10 MG IM SOLR: 5.0000 mg | Freq: Three times a day (TID) | INTRAMUSCULAR | Status: DC | PRN

## 2017-01-08 MED ORDER — TRAZODONE HCL 100 MG PO TABS
100.0000 mg | ORAL_TABLET | Freq: Every day | ORAL | Status: DC
  Administered 2017-01-08 – 2017-01-11 (×3): 100 mg via ORAL
  Filled 2017-01-08 (×6): qty 1
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
## 2017-01-08 NOTE — Progress Notes (Signed)
  DATA ACTION RESPONSE  Objective- Pt. is visible in the dayroom, seen interacting with peers/staff. Often, at the nurses station requesting for something.  Presents with an animated/anxious affect and mood. Needs redirection at times due to loud behaviors.  C/o of insomnia and anxiety this evening.   Subjective- Denies having any HI/Pain at this time. Endorses SI but verbal contracts for safety. +AVH stating "I see and hear sprits and the after-life". Pt. states "My dad makes me angry; I don't want to live anymore".   Is cooperative and remain safe on the unit.  1:1 interaction in private to establish rapport. Encouragement, education, & support given from staff.  PRN [place] requested and will re-eval accordingly.   Safety maintained with Q 15 checks. Continue with POC.

## 2017-01-08 NOTE — Progress Notes (Signed)
Did not  Attend group 

## 2017-01-08 NOTE — Progress Notes (Signed)
D: Pt presents with an animated affect and anxious mood. Pt reports chronic suicidal thoughts with multiple plans since he was a child. Pt endorses SI with no plan or intent while here in the hosp. Pt endorses AVH. Pt reports poor sleep last night due to nightmares. Pt reports ongoing issues with his family and not feeling accepted. Pt stated "All I want to do is die". Pt symptoms are not congruent with his mood. Pt is silly, childlike and attention seeking. Throughout the day pt was observed laughing, joking around with his roommate and making loud noises in his room. Pt redirected as needed by staff for inappropriate loud noises.  A: Medications reviewed with pt. Medications administered as ordered per MD. Verbal support provided. Pt encouraged to attend groups. 15 minute checks performed for safety. Pt plan of care completed in tx team. R: Pt requesting long-term tx at another hosp once he's discharged from [facility]. Pt compliant with attending groups and taking meds. No side effects to meds verbalized by pt.

## 2017-01-08 NOTE — Tx Team (Signed)
Interdisciplinary Treatment and Diagnostic Plan Update  01/08/2017 Time of Session: 11:21 AM  Irl Bodie MRN: [id number]  Principal Diagnosis: <principal problem not specified>  Secondary Diagnoses: Active Problems:   MDD (major depressive disorder), recurrent, severe, with psychosis ([place])   Current Medications:  Current Facility-Administered Medications  Medication Dose Route Frequency Provider Last Rate Last Dose  . alum & mag hydroxide-simeth (MAALOX/MYLANTA) 200-200-20 MG/5ML suspension 30 mL  30 mL Oral Q4H PRN Okonkwo, Justina A, NP      . hydrOXYzine (ATARAX/VISTARIL) tablet 25 mg  25 mg Oral Q6H PRN Okonkwo, Justina A, NP      . ibuprofen (ADVIL,MOTRIN) tablet 600 mg  600 mg Oral Q8H PRN Okonkwo, Justina A, NP   600 mg at 01/07/17 1814  . magnesium hydroxide (MILK OF MAGNESIA) suspension 30 mL  30 mL Oral Daily PRN Okonkwo, Justina A, NP      . OLANZapine zydis (ZYPREXA) disintegrating tablet 5 mg  5 mg Oral TID PRN Ursula Alert, MD       Or  . OLANZapine (ZYPREXA) injection 5 mg  5 mg Intramuscular TID PRN Eappen, Ria Clock, MD      . sertraline (ZOLOFT) tablet 25 mg  25 mg Oral Daily Okonkwo, Justina A, NP   25 mg at 01/08/17 0808  . traZODone (DESYREL) tablet 100 mg  100 mg Oral QHS Ursula Alert, MD        PTA Medications: Prescriptions Prior to Admission  Medication Sig Dispense Refill Last Dose  . ibuprofen (ADVIL,MOTRIN) 600 MG tablet Take 600 mg by mouth daily as needed.  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   Physician Treatment Plan for Primary Diagnosis: <principal problem not specified> Long Term Goal(s): Improvement in symptoms so as ready for  discharge  Short Term Goals:    Medication Management: Evaluate patient's response, side effects, and tolerance of medication regimen.  Therapeutic Interventions: 1 to 1 sessions, Unit Group sessions and Medication administration.  Evaluation of Outcomes: Progressing  Physician Treatment Plan for Secondary Diagnosis: Active Problems:   MDD (major depressive disorder), recurrent, severe, with psychosis ([place])   Long Term Goal(s): Improvement in symptoms so as ready for discharge  Short Term Goals:    Medication Management: Evaluate patient's response, side effects, and tolerance of medication regimen.  Therapeutic Interventions: 1 to 1 sessions, Unit Group sessions and Medication administration.  Evaluation of Outcomes: Progressing   RN Treatment Plan for Primary Diagnosis: <principal problem not specified> Long Term Goal(s): Knowledge of disease and therapeutic regimen to maintain health will improve  Short Term Goals: Ability to disclose and discuss suicidal ideas and Ability to identify and develop effective coping behaviors will improve  Medication Management: RN will administer medications as ordered by provider, will assess and evaluate patient's response and provide education to patient for prescribed medication. RN will report any adverse and/or side effects to prescribing provider.  Therapeutic Interventions: 1 on 1 counseling sessions, Psychoeducation, Medication administration, Evaluate responses to treatment, Monitor vital signs and CBGs as ordered, Perform/monitor CIWA, COWS, AIMS and Fall Risk screenings as ordered, Perform wound care treatments as ordered.  Evaluation of Outcomes: Progressing   LCSW Treatment Plan for Primary Diagnosis: <principal problem not specified> Long Term Goal(s): Safe transition to appropriate next level of  care at discharge, Engage patient in therapeutic group addressing interpersonal concerns.  Short Term Goals: Engage patient in  aftercare planning with referrals and resources  Therapeutic Interventions: Assess for all discharge needs, 1 to 1 time with Social worker, Explore available resources and support systems, Assess for adequacy in community support network, Educate family and significant other(s) on suicide prevention, Complete Psychosocial Assessment, Interpersonal group therapy.  Evaluation of Outcomes: Not Met  Pt has not identified a dispositional plan, and sppears unconcerned about his current homeless situation.  Follow up at community mental health clinic   Progress in Treatment: Attending groups: Yes Participating in groups: Yes Taking medication as prescribed: Yes Toleration medication: Yes, no side effects reported at this time Family/Significant other contact made: No Patient understands diagnosis: Yes AEB asking for help with feeling suicidal Discussing patient identified problems/goals with staff: Yes Medical problems stabilized or resolved: Yes Denies suicidal/homicidal ideation: Yes Issues/concerns per patient self-inventory: None Other: N/A  New problem(s) identified: None identified at this time.   New Short Term/Long Term Goal(s): "I need a referral to a long term hospital, or a place to stay. I need help with feeling suicidal"  Discharge Plan or Barriers:   Reason for Continuation of Hospitalization: Depression Medication stabilization Suicidal ideation    Estimated Length of Stay: 01/12/17  Attendees: Patient: David Lamb 01/08/2017  11:21 AM  Physician: Ursula Alert, MD 01/08/2017  11:21 AM  Nursing: Toney Sang 01/08/2017  11:21 AM  RN Care Manager: Lars Pinks, RN 01/08/2017  11:21 AM  Social Worker: Ripley Fraise 01/08/2017  11:21 AM  Recreational Therapist: Winfield Cunas 01/08/2017  11:21 AM  Other: Norberto Sorenson 01/08/2017  11:21 AM  Other:  01/08/2017  11:21 AM    Scribe for Treatment Team:  Roque Lias LCSW 01/08/2017 11:21 AM

## 2017-01-08 NOTE — [facility] Group Notes (Signed)
[facility] LCSW Group Therapy  01/08/2017 2:37 PM   Type of Therapy:  Group Therapy  Participation Level:  Active  Participation Quality:  Attentive  Affect:  Appropriate  Cognitive:  Appropriate  Insight:  Improving  Engagement in Therapy:  Engaged  Modes of Intervention:  Clarification, Education, Exploration and Socialization  Summary of Progress/Problems: Today's group focused on resilience. Peace stayed the entire time, engaged throughout.     Akram felt he was resilient because he graduated from [employer], was the first in his family to do that, and had no support to do so. He stated that he had found a passion, which helped him stay focused, and that he has a goal of becoming an EMT. Ida Rogue 01/08/2017 , 2:37 PM
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
## 2017-01-08 NOTE — Plan of Care (Signed)
Problem: Medication: Goal: Compliance with prescribed medication regimen will improve Outcome: Progressing Pt. take meds. as prescribed.    

## 2017-01-09 DIAGNOSIS — Z818 Family history of other mental and behavioral disorders: Secondary | ICD-10-CM

## 2017-01-09 DIAGNOSIS — Z590 Homelessness: Secondary | ICD-10-CM

## 2017-01-09 LAB — TSH: TSH: 2.01 u[IU]/mL (ref 0.350–4.500)

## 2017-01-09 NOTE — [facility] Group Notes (Signed)
[facility] Group Therapy  01/09/2017 , 1:58 PM    Type of Therapy:  [facility] [employer] Presentation  Participation Level:  Active  Participation Quality:  Attentive  Affect:  Blunted  Cognitive:  Oriented  Insight:  Limited  Engagement in Therapy:  Engaged  Modes of Intervention:  Discussion, Education and Socialization  Summary of Progress/Problems:  [facility] from [facility] [employer] came to present his recovery story.  David Lamb did not stay in the group the entire time. David Lamb did not speak or actively participate in the discussion.  Most of his time was spent looking out the window at the nurses station.     David Lamb 01/09/2017 , 1:58 PM
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
## 2017-01-09 NOTE — Plan of Care (Signed)
Problem: Safety: Goal: Periods of time without injury will increase Outcome: Progressing Pt remains a low fall risk, denies SI/HI at this time.   

## 2017-01-09 NOTE — Progress Notes (Addendum)
[employer] MD Progress Note  01/09/2017 11:43 AM David Lamb  MRN:  [id number] Subjective:  "I am depressed and suicidal and made an suicide attempt"  Objective: Patient seen, chart reviewed and case discussed with treatment team. Patient continue to endorse feeling depressed, sad, disturbed sleep and appetite. Patient states that he is feeling rejected by his family especially dad and grandma. He has no current relationships. He is staying in his bed after breakfast, so saying his sleep is improved and no longer a problem. He is willing to participate in therapy and milieu. He has no agitation or aggressive behaviors. He has no suicide behaviors or gestures. Continue Q15 min observation. 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     Principal Problem: Major depressive disorder, single episode, severe without psychotic features (HCC) Diagnosis:   Patient Active Problem List   Diagnosis Date Noted  . Major depressive disorder, single episode, severe without psychotic features (HCC) [F32.2] 01/08/2017  . Borderline personality disorder [F60.3] 01/08/2017   Total Time spent with patient: 30 minutes  Past Psychiatric History: He reports he has never been hospitalized on an IP unit , but he has seen a therapist briefly in the past. Pt reports he has never been on medications in the past for mental health issues. Pt reports several suicide attempts x 11 times by cutting , strangling,walking in front of train and so on.  Past Medical History:  Past Medical History:  Diagnosis Date  . Depression   . Suicidal behavior    History reviewed. No pertinent surgical history. Family History:  Family History  Problem Relation Age of Onset  . Mental illness Father   . Hypertension Father   . Diabetes Paternal Grandmother   . COPD Paternal Grandmother    Family Psychiatric  History: see HPI Social History:  History  Alcohol Use  . Yes      History  Drug use: Unknown    Social History   Social History  . Marital status: Single    Spouse name: N/A  . Number of children: N/A  . Years of education: N/A   Social History Main Topics  . Smoking status: Current Some Day Smoker  . Smokeless tobacco: Never Used  . Alcohol use Yes  . Drug use: Unknown  . Sexual activity: Not Asked   Other Topics Concern  . None   Social History Narrative  . None   Additional Social History:    single , unemployed , currently homeless , 12 th grade grad, mother and dad are separated , has two brothers, one is in prison for felonies , other one is in [place].   Sleep: Fair  Appetite:  Fair  Current Medications: Current Facility-Administered Medications  Medication Dose Route Frequency Provider Last Rate Last Dose  . alum & mag hydroxide-simeth (MAALOX/MYLANTA) 200-200-20 MG/5ML suspension 30 mL  30 mL Oral Q4H PRN Okonkwo, Justina A, NP   30 mL at 01/08/17 1435  . hydrOXYzine (ATARAX/VISTARIL) tablet 25 mg  25 mg Oral Q6H PRN Okonkwo, Justina A, NP   25 mg at 01/08/17 2136  . ibuprofen (ADVIL,MOTRIN) tablet 600 mg  600 mg Oral Q8H PRN Okonkwo, Justina A, NP   600 mg at 01/07/17 1814  . magnesium hydroxide (MILK OF MAGNESIA) suspension 30 mL  30 mL Oral Daily PRN Okonkwo, Justina A, NP      . OLANZapine zydis (ZYPREXA) disintegrating tablet 5 mg  5  mg Oral TID PRN Jomarie Longs, MD       Or  . OLANZapine (ZYPREXA) injection 5 mg  5 mg Intramuscular TID PRN Eappen, Levin Bacon, MD      . sertraline (ZOLOFT) tablet 50 mg  50 mg Oral Daily Eappen, Levin Bacon, MD   50 mg at 01/09/17 9604  . traZODone (DESYREL) tablet 100 mg  100 mg Oral QHS Jomarie Longs, MD   100 mg at 01/08/17 2109    Lab Results:  Results for orders placed or performed during the hospital encounter of 01/07/17 (from the past 48 hour(s))  TSH     Status: None   Collection Time: 01/09/17  6:28 AM  Result Value Ref Range   TSH 2.010 0.350 - 4.500 uIU/mL    Comment:  Performed by a 3rd Generation assay with a functional sensitivity of <=0.01 uIU/mL. Performed at [facility], [address], [place], [place] [address]     Blood Alcohol level:  Lab Results  Component Value Date   ETH <5 12/29/2016    Metabolic Disorder Labs: No results found for: HGBA1C, MPG No results found for: PROLACTIN No results found for: CHOL, TRIG, HDL, CHOLHDL, VLDL, LDLCALC  Physical Findings: AIMS: Facial and Oral Movements Muscles of Facial Expression: None, normal Lips and Perioral Area: None, normal Jaw: None, normal Tongue: None, normal,Extremity Movements Upper (arms, wrists, hands, fingers): None, normal Lower (legs, knees, ankles, toes): None, normal, Trunk Movements Neck, shoulders, hips: None, normal, Overall Severity Severity of abnormal movements (highest score from questions above): None, normal Incapacitation due to abnormal movements: None, normal Patient's awareness of abnormal movements (rate only patient's report): No Awareness, Dental Status Current problems with teeth and/or dentures?: No Does patient usually wear dentures?: No  CIWA:    COWS:     Musculoskeletal: Strength & Muscle Tone: within normal limits Gait & Station: normal Patient leans: N/A  Psychiatric Specialty Exam: Physical Exam  ROS  Blood pressure 116/63, pulse (!) 119, temperature 98.3 F (36.8 C), temperature source Oral, resp. rate 20, height 5\' 4"  (1.626 m), weight 102.1 kg (225 lb).Body mass index is 38.62 kg/m.  General Appearance: Guarded  Eye Contact:  Good  Speech:  Clear and Coherent  Volume:  Decreased  Mood:  Depressed  Affect:  Constricted and Depressed  Thought Process:  Coherent and Goal Directed  Orientation:  Full (Time, Place, and Person)  Thought Content:  Rumination  Suicidal Thoughts:  Yes.  with intent/plan, continue to endorse suicide ideation and denied current plans.  Homicidal Thoughts:  No  Memory:  Immediate;    Good Recent;   Fair Remote;   Fair  Judgement:  Impaired  Insight:  Fair  Psychomotor Activity:  Decreased  Concentration:  Concentration: Good and Attention Span: Fair  Recall:  Good  Fund of Knowledge:  Good  Language:  Good  Akathisia:  Negative  Handed:  Right  AIMS (if indicated):     Assets:  Communication Skills Housing Leisure Time Physical Health Resilience Social Support Transportation  ADL's:  Intact  Cognition:  WNL  Sleep:  Number of Hours: 6.25     Treatment Plan Summary: Daily contact with patient to assess and evaluate symptoms and progress in treatment and Medication management   Patient will benefit from inpatient treatment and stabilization.  Estimated length of stay is 5-7 days.  Reviewed past medical records,treatment plan.   Continue Zoloft 50 mg po daily for affective sx. Continue Trazodone 100 mg po qhs prn  for sleep. Will continue to monitor vitals ,medication compliance and treatment side effects while patient is here.  Will monitor for medical issues as well as call consult as needed.  Reviewed labs - uds- negative , cbc - wbc slightly elevated , likely reactive , bmp - wnl . TSH reviewed lab - WNL EKG reviewed - qtc - wnl. CSW will start working on disposition.  Continue treatment Patient to participate in therapeutic milieu .   Leata Mouse, MD 01/09/2017, 11:43 AM

## 2017-01-09 NOTE — Progress Notes (Signed)
Adult Psychoeducational Group Note  Date:  01/09/2017 Time:  8:39 PM  Group Topic/Focus:  Wrap-Up Group:   The focus of this group is to help patients review their daily goal of treatment and discuss progress on daily workbooks.  Participation Level:  Active  Participation Quality:  Attentive  Affect:  Appropriate  Cognitive:  Appropriate  Insight: Good  Engagement in Group:  Engaged  Modes of Intervention:  Discussion, Education, Socialization and Support  Additional Comments:  Pt rated his day at a 4 out of 10 overall. Pt reported that he has had some issues with his family today and his goal was to just try and make it through the day. Pt is looking forward to tomorrow because his mother is coming to visit.  Malachy Moan 01/09/2017, 8:39 PM

## 2017-01-09 NOTE — Progress Notes (Signed)
DATA ACTION RESPONSE  Objective- Pt. is visible in the dayroom, seen interacting with peers/staff.  Presents with an animated/anxious affect and mood. Needs redirection at times due to loud behaviors.   Subjective- Denies having any HI/Pain at this time. Endorses SI but verbal contracts for safety. +AVH stating "Same". Is cooperative and remain safe on the unit.  1:1 interaction in private to establish rapport. Encouragement, education, & support given from staff.    Safety maintained with Q 15 checks. Continue with POC.

## 2017-01-09 NOTE — Progress Notes (Signed)
D: Pt presents with an animated affect and anxious mood. Pt endorses suicidal thoughts. Pt stated "I'm always suicidal". Pt verbally contracts for safety. Pt endorses AVH. Pt stated that he have "supernatural powers". Pt is attention seeking, childlike and silly.  Pt symptoms or incongruent with his mood. Pt reports feeling depressed due to ongoing issues with his parents.  A: Medications reviewed with pt. Medications administered as ordered per MD. Verbal support provided. Pt encouraged to attend groups. 15 minute checks performed for safety. R: Pt compliant with tx. No side effects to meds verbalized by pt.

## 2017-01-10 MED ORDER — NICOTINE POLACRILEX 2 MG MT GUM
2.0000 mg | CHEWING_GUM | OROMUCOSAL | Status: DC | PRN
  Administered 2017-01-10: 2 mg via ORAL

## 2017-01-10 MED ORDER — AMOXICILLIN 500 MG PO CAPS
500.0000 mg | ORAL_CAPSULE | Freq: Three times a day (TID) | ORAL | Status: DC
  Administered 2017-01-10 – 2017-01-14 (×14): 500 mg via ORAL
  Filled 2017-01-10 (×2): qty 1
  Filled 2017-01-10: qty 9
  Filled 2017-01-10 (×2): qty 1
  Filled 2017-01-10: qty 9
  Filled 2017-01-10: qty 1
  Filled 2017-01-10: qty 9
  Filled 2017-01-10 (×2): qty 1
  Filled 2017-01-10: qty 2
  Filled 2017-01-10 (×8): qty 1

## 2017-01-10 NOTE — Progress Notes (Signed)
Data. Patient endorses SI, with no plan, but is able to verbally contract for safety on the unit and to come to staff if SI becomes overwhelming. Patient denies HI/AVH. Patient interacting well with staff and other patients. Patient C/O nightmares, stopping his from having restful sleep each night. Also reported pain to his right lower mouth, "From an abscessed tooth that keeps coming back." Both issues reported to NP. Orders received for ABT TX. Patient does appear top be responding to IS at times. Action. Emotional support and encouragement offered. Education provided on medication, indications and side effect. Q 15 minute checks done for safety. Response. Safety on the unit maintained through 15 minute checks.  Medications taken as prescribed. Attended groups.

## 2017-01-10 NOTE — Plan of Care (Signed)
Problem: Education: Goal: Emotional status will improve Outcome: Progressing Affect is bright and patient observed interacting with smiles and laughter with peers and staff.  Problem: Coping: Goal: Ability to demonstrate self-control will improve Outcome: Progressing Has been appropriate through out shift.  Problem: Safety: Goal: Periods of time without injury will increase Outcome: Progressing No injury this shift.  Problem: Medication: Goal: Compliance with prescribed medication regimen will improve Outcome: Progressing Patient has taken medications as prescribed.  Problem: Self-Concept: Goal: Ability to disclose and discuss suicidal ideas will improve Outcome: Progressing Reported passive SI on his self assessment. Contracts, verbally, for safety.  Problem: Safety: Goal: Ability to redirect hostility and anger into socially appropriate behaviors will improve Outcome: Progressing No angry, or inappropriate behaviors this shift.

## 2017-01-10 NOTE — [facility] Group Notes (Signed)
LCSW Group Therapy Note   01/24/2017 1:15pm   Type of Therapy and Topic:  Group Therapy:  Positive Affirmations   Participation Level:  Active   Description of Group: This group addressed positive affirmation toward self and others. Patients went around the room and identified two positive things about themselves and two positive things about a peer in the room. Patients reflected on how it felt to share something positive with others, to identify positive things about themselves, and to hear positive things from others. Patients were encouraged to have a daily reflection of positive characteristics or circumstances.  Therapeutic Goals 1. Patient will verbalize two of their positive qualities 2. Patient will demonstrate empathy for others by stating two positive qualities about a peer in the group 3. Patient will verbalize their feelings when voicing positive self affirmations and when voicing positive affirmations of others 4. Patients will discuss the potential positive impact on their wellness/recovery of focusing on positive traits of self and others. Summary of Patient Progress:  Garrie came in late to group.  He stayed for the remainder of the group and actively listened.     Therapeutic Modalities Cognitive Behavioral Therapy Motivational Interviewing  Carlynn Herald Work 01/24/2017 11:33 AM
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
## 2017-01-11 ENCOUNTER — Inpatient Hospital Stay (HOSPITAL_COMMUNITY): Payer: Federal, State, Local not specified - Other

## 2017-01-11 ENCOUNTER — Encounter (HOSPITAL_COMMUNITY): Payer: Self-pay | Admitting: Emergency Medicine

## 2017-01-11 NOTE — ED Notes (Signed)
Bed: [facility] Expected date:  Expected time:  Means of arrival:  Comments: 

## 2017-01-11 NOTE — ED Provider Notes (Signed)
[facility]-EMERGENCY DEPT Provider Note   CSN: [id number] Arrival date & time: 01/11/17  1114     History   Chief Complaint Chief Complaint  Patient presents with  . IVC; Hand Injury    HPI David Lamb is a 26 y.o. male.  HPI patient is currently inpatient at [facility]. He punched a wall with his right handwith closed fist this morning with closed fist. No other injury. Complains of pain at right hand at fourth MCP joint dorsal aspect. Pain worse with moving his fingers. Improved with remaining still  Past Medical History:  Diagnosis Date  . Depression   . Suicidal behavior     Patient Active Problem List   Diagnosis Date Noted  . Major depressive disorder, single episode, severe without psychotic features (HCC) 01/08/2017  . Borderline personality disorder 01/08/2017    History reviewed. No pertinent surgical history.     Home Medications    Prior to Admission medications   Medication Sig Start Date End Date Taking? Authorizing Provider  ibuprofen (ADVIL,MOTRIN) 600 MG tablet Take 600 mg by mouth daily as needed. 12/08/16   [provider]    Family History Family History  Problem Relation Age of Onset  . Mental illness Father   . Hypertension Father   . Diabetes Paternal Grandmother   . COPD Paternal Grandmother     Social History Social History  Substance Use Topics  . Smoking status: Current Some Day Smoker  . Smokeless tobacco: Never Used  . Alcohol use Yes  positive marijuana use   Allergies   Patient has no known allergies.   Review of Systems Review of Systems  Constitutional: Negative.   Musculoskeletal: Positive for arthralgias.       Right hand pain     Physical Exam Updated Vital Signs BP (!) 129/91 (BP Location: Right Arm)   Pulse (!) 57   Temp 98.1 F (36.7 C) (Oral)   Resp 16   Ht 5' 4.5" (1.638 m)   Wt 97.5 kg (215 lb)   SpO2 100%   BMI 36.33 kg/m   Physical Exam  Constitutional: He appears well-developed and  well-nourished.  HENT:  Head: Normocephalic and atraumatic.  Eyes: EOM are normal.  Neck: Neck supple. No thyromegaly present.  Cardiovascular: Normal rate.   No murmur heard. Pulmonary/Chest: Effort normal.  Abdominal: He exhibits no distension.  Musculoskeletal: Normal range of motion. He exhibits no edema or tenderness.  right upper extremity skin intact. Dime-sized ecchymosis over fourth MCP joint dorsal aspect.with corresponding tenderness. All digits with full range of motion, good capillary refill. Radial pulse 2+. All other extremity is a contusion abrasion or tenderness neurovascular intact  Neurological: He is alert. Coordination normal.  Skin: Skin is warm and dry. No rash noted.  Psychiatric: He has a normal mood and affect.  Nursing note and vitals reviewed.    ED Treatments / Results  Labs (all labs ordered are listed, but only abnormal results are displayed) Labs Reviewed  TSH    EKG  EKG Interpretation None       Radiology No results found.  Procedures Procedures (including critical care time)  Medications Ordered in ED Medications  alum & mag hydroxide-simeth (MAALOX/MYLANTA) 200-200-20 MG/5ML suspension 30 mL (30 mLs Oral Given 01/08/17 1435)  magnesium hydroxide (MILK OF MAGNESIA) suspension 30 mL (not administered)  ibuprofen (ADVIL,MOTRIN) tablet 600 mg (600 mg Oral Given 01/11/17 1015)  hydrOXYzine (ATARAX/VISTARIL) tablet 25 mg (25 mg Oral Given 01/08/17 2136)  traZODone (DESYREL)  tablet 100 mg (100 mg Oral Given 01/10/17 2155)  OLANZapine zydis (ZYPREXA) disintegrating tablet 5 mg (5 mg Oral Given 01/11/17 1006)    Or  OLANZapine (ZYPREXA) injection 5 mg ( Intramuscular See Alternative 01/11/17 1006)  sertraline (ZOLOFT) tablet 50 mg (50 mg Oral Given 01/11/17 0753)  amoxicillin (AMOXIL) capsule 500 mg (500 mg Oral Given 01/11/17 0607)  nicotine polacrilex (NICORETTE) gum 2 mg (2 mg Oral Given 01/10/17 1819)    X-ray viewed by me Results for orders  placed or performed during the hospital encounter of 01/07/17  TSH  Result Value Ref Range   TSH 2.010 0.350 - 4.500 uIU/mL   Dg Hand Complete Right  Result Date: 01/11/2017 CLINICAL DATA:  Right hand pain and bruising after punching wall today. Initial encounter. EXAM: RIGHT HAND - COMPLETE 3+ VIEW COMPARISON:  None. FINDINGS: There is no evidence of fracture or dislocation. There is no evidence of arthropathy or other focal bone abnormality. Soft tissues are unremarkable. IMPRESSION: Negative. Electronically Signed   By: Myles Rosenthal M.D.   On: 01/11/2017 13:47   Initial Impression / Assessment and Plan / ED Course  I have reviewed the triage vital signs and the nursing notes.  Pertinent labs & imaging results that were available during my care of the patient were reviewed by me and considered in my medical decision making (see chart for details).     Plan patient can hold ice pack on the painful area 4 times daily for 30 minutes at a time.  Final Clinical Impressions(s) / ED Diagnoses  Diagnosis contusion of right hand Final diagnoses:  None    New Prescriptions New Prescriptions   No medications on file     Doug Sou, MD 01/11/17 1357

## 2017-01-11 NOTE — Progress Notes (Signed)
Adult Psychoeducational Group Note  Date:  01/11/2017 Time:  8:51 PM  Group Topic/Focus:  Wrap-Up Group:   The focus of this group is to help patients review their daily goal of treatment and discuss progress on daily workbooks.  Participation Level:  Active  Participation Quality:  Appropriate  Affect:  Appropriate  Cognitive:  Appropriate  Insight: Appropriate  Engagement in Group:  Engaged  Modes of Intervention:  Discussion  Additional Comments:  The patient expressed that he rates today a 5.  Octavio Manns 01/11/2017, 8:51 PM

## 2017-01-11 NOTE — Progress Notes (Signed)
Writer has observed patient up and active on the unit tonight. He requested to shave after group which he did. He has been up at the nursing station frequently just leaning on the counter but wanting nothing. He reports always feeling suicidal and knows things before they happen, He reports that he know that he knows that people thinks he's crazy.

## 2017-01-11 NOTE — Progress Notes (Signed)
1:1 Note:  Patient remains at the ED for medical evaluation of his right hand.  Staff remain with patient, while on 1:1 staff observation at the ED, for safety.

## 2017-01-11 NOTE — ED Notes (Signed)
Pt ambulatory and independent at discharge.  Verbalized understanding of discharge.

## 2017-01-11 NOTE — Progress Notes (Signed)
1:1 Note.  Patient has remained on 1:1 staff observation, for safety. Patient has slept most of the afternoon. Staff have remained within short distance and patient has been within eyesight at all times. Safety has been maintained. Patient does continue to deny self harm thoughts/feelings currently. He has stated, "If I get upset, or angry, I am just going to put my hand in my pocket and get my stress ball and use it." Patient's hand has had decreas in swelling and he states the pain is, "Not bad".

## 2017-01-11 NOTE — ED Notes (Signed)
Bed: [facility] Expected date:  Expected time:  Means of arrival:  Comments: 

## 2017-01-11 NOTE — Progress Notes (Signed)
Patient has been observed up and active, laughing and talking with peers. He from time to time would come to nursing station and converse with staff. He was informed of his medications and was compliant with them. He requested motrin for his tooth which he received. Support given and safety maintained on unit with 15 min checks.
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
## 2017-01-11 NOTE — Progress Notes (Signed)
1:1 Nursing note - Patient has been observed up in the dayroom laughing and joking with peers. He asked Clinical research associate why he was on a 1:1 and Clinical research associate explained his actions on today and his doctor decided to place him on this until tomorrow. He was receptive and reported that the incident with the patient on [address] was not true. He has been pleasant and cooperative, attended group and took medication scheduled. He is currently lying in bed awake resting. 1:1 continues and patient is safe with mht at bedside.

## 2017-01-11 NOTE — Progress Notes (Signed)
[facility] MD Progress Note  01/11/2017 10:30 AM David Lamb  MRN:  [id number] Subjective:  Patient states " I am in pain.'  Objective:Patient seen and chart reviewed.Discussed patient with treatment team.  Pt today seen as anxious , agitated , aggressive , punching walls . This AM RN reported to Clinical research associate that pt was making another peer uncomfortable .This was discussed with pt by staff and he was placed on unit restrictions for now until he is more calm and is able to set boundaries. This made pt angry , he started punching walls and currently  Has swelling on his hand dorsal aspect , right side. When writer attempted to evaluate pt, he became more angry , and started punching walls again. Pt offered PRN medications to calm down, pt offered pain management. Pt to be send to ED for further management of his hand injury.    Principal Problem: Major depressive disorder, single episode, severe without psychotic features (HCC) Diagnosis:   Patient Active Problem List   Diagnosis Date Noted  . Major depressive disorder, single episode, severe without psychotic features (HCC) [F32.2] 01/08/2017    Priority: High  . Borderline personality disorder [F60.3] 01/08/2017   Total Time spent with patient: 30 minutes  Past Psychiatric History: Please see H&P.   Past Medical History:   Past Medical History:  Diagnosis Date  . Depression   . Suicidal behavior    History reviewed. No pertinent surgical history. Family History:  Family History  Problem Relation Age of Onset  . Mental illness Father   . Hypertension Father   . Diabetes Paternal Grandmother   . COPD Paternal Grandmother    Family Psychiatric  History: Please see H&P.  Social History:  History  Alcohol Use  . Yes     History  Drug use: Unknown    Social History   Social History  . Marital status: Single    Spouse name: N/A  . Number of children: N/A  . Years of education: N/A   Social History Main Topics  . Smoking  status: Current Some Day Smoker  . Smokeless tobacco: Never Used  . Alcohol use Yes  . Drug use: Unknown  . Sexual activity: Not Asked   Other Topics Concern  . None   Social History Narrative  . None   Additional Social History:                         Sleep: Fair  Appetite:  Fair  Current Medications: Current Facility-Administered Medications  Medication Dose Route Frequency Provider Last Rate Last Dose  . alum & mag hydroxide-simeth (MAALOX/MYLANTA) 200-200-20 MG/5ML suspension 30 mL  30 mL Oral Q4H PRN Okonkwo, Justina A, NP   30 mL at 01/08/17 1435  . amoxicillin (AMOXIL) capsule 500 mg  500 mg Oral TID WC Truman Hayward, FNP   500 mg at 01/11/17 0607  . hydrOXYzine (ATARAX/VISTARIL) tablet 25 mg  25 mg Oral Q6H PRN Okonkwo, Justina A, NP   25 mg at 01/08/17 2136  . ibuprofen (ADVIL,MOTRIN) tablet 600 mg  600 mg Oral Q8H PRN Okonkwo, Justina A, NP   600 mg at 01/11/17 1015  . magnesium hydroxide (MILK OF MAGNESIA) suspension 30 mL  30 mL Oral Daily PRN Okonkwo, Justina A, NP      . nicotine polacrilex (NICORETTE) gum 2 mg  2 mg Oral PRN Truman Hayward, FNP   2 mg at 01/10/17 1819  . OLANZapine zydis (  ZYPREXA) disintegrating tablet 5 mg  5 mg Oral TID PRN Jomarie Longs, MD   5 mg at 01/11/17 1006   Or  . OLANZapine (ZYPREXA) injection 5 mg  5 mg Intramuscular TID PRN Shelonda Saxe, Levin Bacon, MD      . sertraline (ZOLOFT) tablet 50 mg  50 mg Oral Daily Numair Masden, MD   50 mg at 01/11/17 0753  . traZODone (DESYREL) tablet 100 mg  100 mg Oral QHS Namiko Pritts, Levin Bacon, MD   100 mg at 01/10/17 2155    Lab Results: No results found for this or any previous visit (from the past 48 hour(s)).  Blood Alcohol level:  Lab Results  Component Value Date   ETH <5 12/29/2016    Metabolic Disorder Labs: No results found for: HGBA1C, MPG No results found for: PROLACTIN No results found for: CHOL, TRIG, HDL, CHOLHDL, VLDL, LDLCALC  Physical Findings: AIMS: Facial and Oral  Movements Muscles of Facial Expression: None, normal Lips and Perioral Area: None, normal Jaw: None, normal Tongue: None, normal,Extremity Movements Upper (arms, wrists, hands, fingers): None, normal Lower (legs, knees, ankles, toes): None, normal, Trunk Movements Neck, shoulders, hips: None, normal, Overall Severity Severity of abnormal movements (highest score from questions above): None, normal Incapacitation due to abnormal movements: None, normal Patient's awareness of abnormal movements (rate only patient's report): No Awareness, Dental Status Current problems with teeth and/or dentures?: No Does patient usually wear dentures?: No  CIWA:    COWS:     Musculoskeletal: Strength & Muscle Tone: within normal limits Gait & Station: normal Patient leans: N/A  Psychiatric Specialty Exam: Physical Exam  Nursing note and vitals reviewed.   Review of Systems  Psychiatric/Behavioral: Positive for depression and suicidal ideas. The patient is nervous/anxious.   All other systems reviewed and are negative.   Blood pressure 119/76, pulse 75, temperature 98 F (36.7 C), temperature source Oral, resp. rate 16, height 5\' 4"  (1.626 m), weight 102.1 kg (225 lb).Body mass index is 38.62 kg/m.  General Appearance: Casual  Eye Contact:  Fair  Speech:  Normal Rate  Volume:  Increased  Mood:  Angry, Anxious and Irritable  Affect:  Labile  Thought Process:  Goal Directed and Descriptions of Associations: Circumstantial  Orientation:  Other:  alert  Thought Content:  Paranoid Ideation  Suicidal Thoughts:  Yes.  with intent/plan, contracts for safety  Homicidal Thoughts:  is angry , aggressive , denies HI  Memory:  Immediate;   Fair Recent;   Fair Remote;   Fair  Judgement:  Poor  Insight:  Shallow  Psychomotor Activity:  Increased and Restlessness  Concentration:  Concentration: Fair and Attention Span: Poor  Recall:  [employer] of Knowledge:  Fair  Language:  Fair  Akathisia:  No   Handed:  Right  AIMS (if indicated):     Assets:  Others:  Access to health care  ADL's:  Intact  Cognition:  WNL  Sleep:  Number of Hours: 6.75     Treatment Plan Summary:Patient with several social stressors like job loss, relational issues with family, break up with girl friend , homelessness , and coexisting alcohol and cannabis abuse , presented with SI , reports hx of several attempts in the past , was actively suicidal while in ED , currently exhibiting borderline personality traits/do sx , inability to control his impulses , is labile , acting out and continues to report SI. Pt to be send out to ED for management of hand injury Pt reports extreme pain,  and has swelling of right hand - will not Pharmacist, hospital examine it. Daily contact with patient to assess and evaluate symptoms and progress in treatment, Medication management and Plan no restrictions   Zoloft 50 mg po daily for affective sx Trazodone 100 mg po qhs for insomnia. Continue PRN medications as per agitation protocol Pt to be started on 1;1 precaution for his current impulsivity/acting out , self injurious behavior . Pt to be transferred to ED for evaluation of hand injury. CSW will continue to work on disposition.    Dhruvi Crenshaw, MD 01/11/2017, 10:30 AM

## 2017-01-11 NOTE — ED Triage Notes (Signed)
Pt from [facility]. Pt reports he punched a wall today with his R hand. Bruising noted to posterior hand Pt to return to [facility] after hand evaluation

## 2017-01-12 MED ORDER — NICOTINE 21 MG/24HR TD PT24
21.0000 mg | MEDICATED_PATCH | Freq: Every day | TRANSDERMAL | Status: DC
  Administered 2017-01-12 – 2017-01-14 (×3): 21 mg via TRANSDERMAL
  Filled 2017-01-12 (×5): qty 1

## 2017-01-12 MED ORDER — TRAZODONE HCL 150 MG PO TABS
150.0000 mg | ORAL_TABLET | Freq: Every day | ORAL | Status: DC
  Administered 2017-01-13: 150 mg via ORAL
  Filled 2017-01-12: qty 7
  Filled 2017-01-12 (×3): qty 1

## 2017-01-12 NOTE — Progress Notes (Addendum)
1:1 Nursing note -( Late entry d/t incident with another patient on [address] hall )  Patient has been up and in the dayroom at time it was opened. He slept last night and requested motrin with his am antibiotic. 1:1 continues and patient is safe with mht at patient's side.

## 2017-01-12 NOTE — Plan of Care (Signed)
Problem: Education: Goal: Mental status will improve Outcome: Progressing Patient denies SI/HI/AVH and contracts for safety on the unit.   

## 2017-01-12 NOTE — Progress Notes (Signed)
[facility] Group Notes:  (Nursing/MHT/Case Management/Adjunct)  Date:  01/12/2017  Time:  9:10 PM  Type of Therapy:  Psychoeducational Skills  Participation Level:  Active  Participation Quality:  Attentive  Affect:  Appropriate  Cognitive:  Appropriate  Insight:  Good  Engagement in Group:  Engaged  Modes of Intervention:  Education  Summary of Progress/Problems: The patient stated that he had a good day since he had a good family visit. He would not elaborate any further. As for the theme of the day, he states that he does not have a support system.   Thyra Yinger S 01/12/2017, 9:10 PM

## 2017-01-12 NOTE — Progress Notes (Signed)
Patient's 1:1 discontinued. Patient is able to contract for safety and to use his coping skills, instead of punching things, or tying to hurt himself. He also agrees to come to staff if his SI thoughts/feelings become too much and to let staff know he is feeling increasing depression.  Patient was also taken off unit restriction. Patient and nurse discussed patient's behavior toward male peers and his boundaries, therein, including no gifts, no touching, no discussing relationship issues and no making plans to meet up after they leave.  Patient immediately stated, "But (another patient) asked her if she was feeling threatened and she said 'No'. So I wrote her this song and I want her to tell me what she thinks of it." Behavioral expectations were reiterated and patient agreed that he would follow them, including not giving any patient his song. Poor in sight shown.

## 2017-01-12 NOTE — Progress Notes (Signed)
Data. Patient denies HI/AVH. Did endorse passive SI, but is able to verbally contract for safety on the unit and to come to staff before acting of any self harm thoughts/feelings/voices.  Patient interacting well with staff and other patients.  Action. Emotional support and encouragement offered. Education provided on medication, indications and side effect. Q 15 minute checks done for safety. Response. Safety on the unit maintained through 15 minute checks.  Medications taken as prescribed. Attended group. Remained calm and appropriate through out shift.

## 2017-01-12 NOTE — Progress Notes (Signed)
[facility] MD Progress Note  01/12/2017 2:37 PM David Lamb  MRN:  [id number] Subjective: Patient states " I feel ok."   Objective:Patient seen and chart reviewed.Discussed patient with treatment team.  Pt today seen as less anxious , less agitated , is redirectable. Pt reports pain in his hand , S/P punching wall yesterday , as improving. Pt reports medications as OK, denies any ADRs. Pt was placed on UR as well as 1:1 PRECAUTION for safety due to his SI as well as self injurious behaviors as well as impulsivity. Pt agrees to work on setting boundaries and ask for help prior to exhibiting such behaviors. Pt reports sleep as restless.     Principal Problem: Major depressive disorder, single episode, severe without psychotic features (HCC) Diagnosis:   Patient Active Problem List   Diagnosis Date Noted  . Major depressive disorder, single episode, severe without psychotic features (HCC) [F32.2] 01/08/2017    Priority: High  . Borderline personality disorder [F60.3] 01/08/2017   Total Time spent with patient: 30 minutes  Past Psychiatric History: Please see H&P.   Past Medical History:   Past Medical History:  Diagnosis Date  . Depression   . Suicidal behavior    History reviewed. No pertinent surgical history. Family History:  Family History  Problem Relation Age of Onset  . Mental illness Father   . Hypertension Father   . Diabetes Paternal Grandmother   . COPD Paternal Grandmother    Family Psychiatric  History: Please see H&P.  Social History:  History  Alcohol Use  . Yes     History  Drug use: Unknown    Social History   Social History  . Marital status: Single    Spouse name: N/A  . Number of children: N/A  . Years of education: N/A   Social History Main Topics  . Smoking status: Current Some Day Smoker  . Smokeless tobacco: Never Used  . Alcohol use Yes  . Drug use: Unknown  . Sexual activity: Not Asked   Other Topics Concern  . None   Social  History Narrative  . None   Additional Social History:                         Sleep: Fair  Appetite:  Fair  Current Medications: Current Facility-Administered Medications  Medication Dose Route Frequency Provider Last Rate Last Dose  . alum & mag hydroxide-simeth (MAALOX/MYLANTA) 200-200-20 MG/5ML suspension 30 mL  30 mL Oral Q4H PRN Okonkwo, Justina A, NP   30 mL at 01/08/17 1435  . amoxicillin (AMOXIL) capsule 500 mg  500 mg Oral TID WC Truman Hayward, FNP   500 mg at 01/12/17 1212  . hydrOXYzine (ATARAX/VISTARIL) tablet 25 mg  25 mg Oral Q6H PRN Okonkwo, Justina A, NP   25 mg at 01/12/17 1345  . ibuprofen (ADVIL,MOTRIN) tablet 600 mg  600 mg Oral Q8H PRN Okonkwo, Justina A, NP   600 mg at 01/12/17 0603  . magnesium hydroxide (MILK OF MAGNESIA) suspension 30 mL  30 mL Oral Daily PRN Okonkwo, Justina A, NP      . nicotine (NICODERM CQ - dosed in mg/24 hours) patch 21 mg  21 mg Transdermal Daily Koda Routon, MD   21 mg at 01/12/17 0901  . OLANZapine zydis (ZYPREXA) disintegrating tablet 5 mg  5 mg Oral TID PRN Jomarie Longs, MD   5 mg at 01/11/17 1006   Or  . OLANZapine (ZYPREXA) injection 5  mg  5 mg Intramuscular TID PRN Jomarie Longs, MD      . sertraline (ZOLOFT) tablet 50 mg  50 mg Oral Daily Daphene Chisholm, Levin Bacon, MD   50 mg at 01/12/17 7829  . traZODone (DESYREL) tablet 100 mg  100 mg Oral QHS Modene Andy, Levin Bacon, MD   100 mg at 01/11/17 2135    Lab Results: No results found for this or any previous visit (from the past 48 hour(s)).  Blood Alcohol level:  Lab Results  Component Value Date   ETH <5 12/29/2016    Metabolic Disorder Labs: No results found for: HGBA1C, MPG No results found for: PROLACTIN No results found for: CHOL, TRIG, HDL, CHOLHDL, VLDL, LDLCALC  Physical Findings: AIMS: Facial and Oral Movements Muscles of Facial Expression: None, normal Lips and Perioral Area: None, normal Jaw: None, normal Tongue: None, normal,Extremity Movements Upper  (arms, wrists, hands, fingers): None, normal Lower (legs, knees, ankles, toes): None, normal, Trunk Movements Neck, shoulders, hips: None, normal, Overall Severity Severity of abnormal movements (highest score from questions above): None, normal Incapacitation due to abnormal movements: None, normal Patient's awareness of abnormal movements (rate only patient's report): No Awareness, Dental Status Current problems with teeth and/or dentures?: No Does patient usually wear dentures?: No  CIWA:    COWS:     Musculoskeletal: Strength & Muscle Tone: within normal limits Gait & Station: normal Patient leans: N/A  Psychiatric Specialty Exam: Physical Exam  Nursing note and vitals reviewed.   Review of Systems  Psychiatric/Behavioral: Positive for depression and suicidal ideas. The patient is nervous/anxious.   All other systems reviewed and are negative.   Blood pressure (!) 149/94, pulse 94, temperature 97.6 F (36.4 C), resp. rate 20, height 5' 4.5" (1.638 m), weight 97.5 kg (215 lb), SpO2 100 %.Body mass index is 36.33 kg/m.  General Appearance: Casual  Eye Contact:  Fair  Speech:  Normal Rate  Volume:  Normal  Mood:  Anxious, improving  Affect:  Congruent  Thought Process:  Goal Directed and Descriptions of Associations: Circumstantial  Orientation:  Full (Time, Place, and Person)  Thought Content:  Paranoid Ideation and Rumination  Suicidal Thoughts:  Yes.  without intent/plan contracts for safety  Homicidal Thoughts:  No  Memory:  Immediate;   Fair Recent;   Fair Remote;   Fair  Judgement:  Impaired  Insight:  Shallow  Psychomotor Activity:  Increased and Restlessness, improving  Concentration:  Concentration: Fair and Attention Span: Fair  Recall:  [employer] of Knowledge:  Fair  Language:  Fair  Akathisia:  No  Handed:  Right  AIMS (if indicated):     Assets:  Desire for Improvement  ADL's:  Intact  Cognition:  WNL  Sleep:  Number of Hours: 6.25   Will  continue today 01/12/17  plan as below except where it is noted.   Treatment Plan Summary:Patient with social stressors like job loss, relational stressors , break up with girl friend as well as homelessness, presents as less anxious today , reports medications as partially effective , needs some help with sleep.  Will continue treatment.  Daily contact with patient to assess and evaluate symptoms and progress in treatment, Medication management and Plan see below   Zoloft 50 mg po daily for affective sx Increase Trazodone to 150 mg po qhs for insomnia. Discontinue 1:1 precaution today, discontinue UR today. Continue PRN medications as per agitation protocol Patient's xray - no fracture on right hand. CSW will continue to work on disposition.  Philis Doke, MD 01/12/2017, 2:37 PM

## 2017-01-12 NOTE — Progress Notes (Signed)
Patient currently sleeping in bed. Respirations even and unlabored. Will not wake patient for sleep medication at this time. Will continue to monitor and administer as needed.

## 2017-01-12 NOTE — Progress Notes (Signed)
1:1 Nursing note ( late entry ) Patient is currently in bed asleep with eyes closed and respirations even and unlabored. 1:1 continues and patient is safe with mht at bedside.

## 2017-01-12 NOTE — Progress Notes (Signed)
Nursing Progress Note [id number]  D) Patient presents anxious but is cooperative on the unit. Patient observed up in the milieu and attended group this evening. Patient inquired with Clinical research associate about possible discharge. Patient states, "I am ready to go now. I feel like a new man". Patient informed to discuss discharge plans with provider in the morning; patient verbalized understanding. Patient complains of mild R lower tooth pain and requests medication, however patient medicated by previous shift at 1811. Patient offered ice pack for pain. Patient currently denies SI/HI/AVH and contracts for safety on the unit. Patient requested trazodone at start of shift but was asleep by 2200 without medication.  A) Emotional support given. 1:1 interaction and active listening provided. No medications administered this shift, patient already asleep. Snacks and fluids provided. Opportunities for questions or concerns presented to patient. Patient encouraged to continue to work on treatment goals. Labs, vital signs and patient behavior monitored throughout shift. Patient safety maintained with q15 min safety checks. Low fall risk precautions in place and reviewed with patient; patient verbalized understanding.  R) Patient receptive to interaction with nurse. Patient remains safe on the unit at this time. Patient denies any adverse medication reactions at this time. Patient is resting in bed without complaints. Will continue to monitor.

## 2017-01-12 NOTE — [facility] Group Notes (Signed)
[facility] LCSW Group Therapy Note  Date/Time:  01/12/2017  11:00AM-12:00PM  Type of Therapy and Topic:  Group Therapy:  Music and Mood  Participation Level:  Active   Description of Group: In this process group, members listened to a variety of genres of music and identified that different types of music evoke different responses.  Patients were encouraged to identify music that was soothing for them and music that was energizing for them.  Patients discussed how this knowledge can help with wellness and recovery in various ways including managing depression and anxiety as well as encouraging healthy sleep habits.    Therapeutic Goals: 1. Patients will explore the impact of different varieties of music on mood 2. Patients will verbalize the thoughts they have when listening to different types of music 3. Patients will identify music that is soothing to them as well as music that is energizing to them 4. Patients will discuss how to use this knowledge to assist in maintaining wellness and recovery 5. Patients will explore the use of music as a coping skill  Summary of Patient Progress:  At the beginning of group, patient expressed that he was "happy, excited, but also anxious."  He participated actively, with a notable change in his affect when music made him sad, sleepy, or motivated.  At the end of the group he said he felt happy, relaxed, and calm.  Therapeutic Modalities: Solution Focused Brief Therapy Motivational Interviewing Activity   Ambrose Mantle, LCSW 01/12/2017 8:35 AM

## 2017-01-13 NOTE — Progress Notes (Signed)
Adult Psychoeducational Group Note  Date:  01/13/2017 Time:  9:26 PM  Group Topic/Focus:  Wrap-Up Group:   The focus of this group is to help patients review their daily goal of treatment and discuss progress on daily workbooks.  Participation Level:  Active  Participation Quality:  Appropriate  Affect:  Appropriate  Cognitive:  Appropriate  Insight: Appropriate  Engagement in Group:  Engaged  Modes of Intervention:  Discussion  Additional Comments:  The patient expressed that he rates today a 10.The patient also said that his goal was to improve his mental health.  Octavio Manns 01/13/2017, 9:26 PM

## 2017-01-13 NOTE — Progress Notes (Signed)
[facility] MD Progress Note  01/13/2017 12:55 PM [name]  MRN:  [id number]   Subjective: David Lamb reports " I don't have anywhere to go, so I may have to stay until tomorrow" Patient reports he will call his mother at to see if he is able to live with her after discharge.  Objective: David Lamb is awake, alert and oriented. Seen standing in the hallway on the unit. Denies  suicidal or homicidal ideation during this discussion. Reports he sees the supernatural all the time which is normal for him.   Denies auditory or visual hallucination and does not appear to be responding to internal stimuli. Patient present with a flat affect. Patient is hopeful to discharge today or tomorrow. Patient reports he is been medication compliant without mediation side effects.  Report learning new coping skills. David Lamb denies depression or depressive symptoms at this time. Reports good appetite and reports resting well. David Lamb is attempting to finding living arraignment. Support, encouragement and reassurance was provided.     Principal Problem: Major depressive disorder, single episode, severe without psychotic features (HCC) Diagnosis:   Patient Active Problem List   Diagnosis Date Noted  . Major depressive disorder, single episode, severe without psychotic features (HCC) [F32.2] 01/08/2017  . Borderline personality disorder [F60.3] 01/08/2017   Total Time spent with patient: 30 minutes  Past Psychiatric History: Please see H&P.   Past Medical History:   Past Medical History:  Diagnosis Date  . Depression   . Suicidal behavior    History reviewed. No pertinent surgical history. Family History:  Family History  Problem Relation Age of Onset  . Mental illness Father   . Hypertension Father   . Diabetes Paternal Grandmother   . COPD Paternal Grandmother    Family Psychiatric  History: Please see H&P.  Social History:  History  Alcohol Use  . Yes     History  Drug use: Unknown    Social  History   Social History  . Marital status: Single    Spouse name: N/A  . Number of children: N/A  . Years of education: N/A   Social History Main Topics  . Smoking status: Current Some Day Smoker  . Smokeless tobacco: Never Used  . Alcohol use Yes  . Drug use: Unknown  . Sexual activity: Not Asked   Other Topics Concern  . None   Social History Narrative  . None   Additional Social History:                         Sleep: Fair  Appetite:  Fair  Current Medications: Current Facility-Administered Medications  Medication Dose Route Frequency Provider Last Rate Last Dose  . alum & mag hydroxide-simeth (MAALOX/MYLANTA) 200-200-20 MG/5ML suspension 30 mL  30 mL Oral Q4H PRN Okonkwo, Justina A, NP   30 mL at 01/08/17 1435  . amoxicillin (AMOXIL) capsule 500 mg  500 mg Oral TID WC Truman Hayward, FNP   500 mg at 01/13/17 1204  . hydrOXYzine (ATARAX/VISTARIL) tablet 25 mg  25 mg Oral Q6H PRN Okonkwo, Justina A, NP   25 mg at 01/13/17 1024  . ibuprofen (ADVIL,MOTRIN) tablet 600 mg  600 mg Oral Q8H PRN Okonkwo, Justina A, NP   600 mg at 01/13/17 4098  . magnesium hydroxide (MILK OF MAGNESIA) suspension 30 mL  30 mL Oral Daily PRN Okonkwo, Justina A, NP      . nicotine (NICODERM CQ - dosed in mg/24 hours) patch  21 mg  21 mg Transdermal Daily Jomarie Longs, MD   21 mg at 01/13/17 4098  . OLANZapine zydis (ZYPREXA) disintegrating tablet 5 mg  5 mg Oral TID PRN Jomarie Longs, MD   5 mg at 01/12/17 1608   Or  . OLANZapine (ZYPREXA) injection 5 mg  5 mg Intramuscular TID PRN Eappen, Levin Bacon, MD      . sertraline (ZOLOFT) tablet 50 mg  50 mg Oral Daily Eappen, Levin Bacon, MD   50 mg at 01/13/17 0823  . traZODone (DESYREL) tablet 150 mg  150 mg Oral QHS Eappen, Saramma, MD        Lab Results: No results found for this or any previous visit (from the past 48 hour(s)).  Blood Alcohol level:  Lab Results  Component Value Date   ETH <5 12/29/2016    Metabolic Disorder  Labs: No results found for: HGBA1C, MPG No results found for: PROLACTIN No results found for: CHOL, TRIG, HDL, CHOLHDL, VLDL, LDLCALC  Physical Findings: AIMS: Facial and Oral Movements Muscles of Facial Expression: None, normal Lips and Perioral Area: None, normal Jaw: None, normal Tongue: None, normal,Extremity Movements Upper (arms, wrists, hands, fingers): None, normal Lower (legs, knees, ankles, toes): None, normal, Trunk Movements Neck, shoulders, hips: None, normal, Overall Severity Severity of abnormal movements (highest score from questions above): None, normal Incapacitation due to abnormal movements: None, normal Patient's awareness of abnormal movements (rate only patient's report): No Awareness, Dental Status Current problems with teeth and/or dentures?: No Does patient usually wear dentures?: No  CIWA:    COWS:     Musculoskeletal: Strength & Muscle Tone: within normal limits Gait & Station: normal Patient leans: N/A  Psychiatric Specialty Exam: Physical Exam  Nursing note and vitals reviewed.   Review of Systems  Psychiatric/Behavioral: Positive for depression and suicidal ideas. The patient is nervous/anxious.   All other systems reviewed and are negative.   Blood pressure (!) 149/94, pulse 94, temperature 97.6 F (36.4 C), resp. rate 20, height 5' 4.5" (1.638 m), weight 97.5 kg (215 lb), SpO2 100 %.Body mass index is 36.33 kg/m.  General Appearance: Casual  Eye Contact:  Fair  Speech:  Normal Rate  Volume:  Normal  Mood:  Anxious, improving  Affect:  Congruent  Thought Process:  Goal Directed and Descriptions of Associations: Circumstantial  Orientation:  Full (Time, Place, and Person)  Thought Content:  Paranoid Ideation and Rumination  Suicidal Thoughts:  No  Homicidal Thoughts:  No  Memory:  Immediate;   Fair Recent;   Fair Remote;   Fair  Judgement:  Impaired  Insight:  Shallow  Psychomotor Activity:  Normal,   Concentration:  Concentration:  Fair and Attention Span: Fair  Recall:  [employer] of Knowledge:  Fair  Language:  Fair  Akathisia:  No  Handed:  Right  AIMS (if indicated):     Assets:  Desire for Improvement  ADL's:  Intact  Cognition:  WNL  Sleep:  Number of Hours: 6.75     I agree with current treatment plan on 01/13/2017, Patient seen face-to-face for psychiatric evaluation follow-up, chart reviewed and case discussed. Reviewed the information documented and agree with the treatment plan.  Will continue today 01/13/17  plan as below except where it is noted.  Treatment Plan Summary: Daily contact with patient to assess and evaluate symptoms and progress in treatment, Medication management and Plan see below   Continue Zoloft 50 mg po daily for affective sx Continue Trazodone to 150 mg  po qhs for insomnia. Discontinue 1:1 precaution today, discontinue UR today. Continue PRN medications as per agitation protocol Patient's xray - no fracture on right hand. CSW will continue to work on disposition.  Oneta Rack, NP 01/13/2017, 12:55 PM  Agree with NP Progress Note

## 2017-01-13 NOTE — Progress Notes (Signed)
Recreation Therapy Notes  Date: 01/13/17 Time: 1015 Location: 500 Hall Dayroom  Group Topic: Life Goals, Goal Setting  Goal Area(s) Addresses:  Patient will be able to identify at least 3 life goals.  Patient will be able to identify benefit of investing in life goals.  Patient will be able to identify benefit of setting life goals.   Behavioral Response:  Engaged  Intervention: Goal sheet, pencils  Activity: Life Goals.  Patients were given a worksheet that identified six areas (family, friends, spirituality, work/school, body and mental health).  Patients were to identify the things they do well, what they needed to improve and set a goal to make the improvement.  Patients would then pick their top three categories to share with the group.  Education:  Discharge Planning, Coping Skills, Life Goals   Education Outcome: Acknowledges Education/In Group Clarification Provided/Needs Additional Education  Clinical Observations:  Pt was quiet and engaged when prompted.  Pt identified family, work and friends as his top three categories.  Pt expressed for family- good at keeping touch with them, needs to improve staying away from the bad ones and a goal of keeping certain parts of his family out of his life.  For work- good at finding a job, improve at finding a job close to home and a goal of finding a job he likes.  For friends- good friends, needs to improve keeping away from bad friends and set a goal of getting friends that will support him.   David Lamb, LRT/CTRS         [name], [name] 01/13/2017 12:12 PM

## 2017-01-13 NOTE — Progress Notes (Signed)
Patient ID: David Lamb, male   DOB: 04-21-1991, 26 y.o.   MRN: [id number] D) Pt has been intrusive and attention seeking. Grandiose. Pt has had numerous somatic c/o. Pt continues to pain in right hand as a result of punching a wall two days ago, despite being cleared by ED. Pt denies a/v/h but says he "always hears spirits". Denies s.i. A) Level 3 obs for safety, support and encouragement provided. Med ed reinforced. Ice applied to injury. Contract for safety. R) Cooperative.

## 2017-01-13 NOTE — Tx Team (Signed)
Interdisciplinary Treatment and Diagnostic Plan Update  01/13/2017 Time of Session: 8:23 AM  David Lamb MRN: [id number]  Principal Diagnosis: Major depressive disorder, single episode, severe without psychotic features ([place])  Secondary Diagnoses: Principal Problem:   Major depressive disorder, single episode, severe without psychotic features ([place]) Active Problems:   Borderline personality disorder   Current Medications:  Current Facility-Administered Medications  Medication Dose Route Frequency Provider Last Rate Last Dose  . alum & mag hydroxide-simeth (MAALOX/MYLANTA) 200-200-20 MG/5ML suspension 30 mL  30 mL Oral Q4H PRN Okonkwo, Justina A, NP   30 mL at 01/08/17 1435  . amoxicillin (AMOXIL) capsule 500 mg  500 mg Oral TID WC Nanci Pina, FNP   500 mg at 01/13/17 6789  . hydrOXYzine (ATARAX/VISTARIL) tablet 25 mg  25 mg Oral Q6H PRN Okonkwo, Justina A, NP   25 mg at 01/12/17 1345  . ibuprofen (ADVIL,MOTRIN) tablet 600 mg  600 mg Oral Q8H PRN Okonkwo, Justina A, NP   600 mg at 01/13/17 3810  . magnesium hydroxide (MILK OF MAGNESIA) suspension 30 mL  30 mL Oral Daily PRN Okonkwo, Justina A, NP      . nicotine (NICODERM CQ - dosed in mg/24 hours) patch 21 mg  21 mg Transdermal Daily Eappen, Ria Clock, MD   21 mg at 01/13/17 1751  . OLANZapine zydis (ZYPREXA) disintegrating tablet 5 mg  5 mg Oral TID PRN Ursula Alert, MD   5 mg at 01/12/17 1608   Or  . OLANZapine (ZYPREXA) injection 5 mg  5 mg Intramuscular TID PRN Eappen, Ria Clock, MD      . sertraline (ZOLOFT) tablet 50 mg  50 mg Oral Daily Eappen, Ria Clock, MD   50 mg at 01/12/17 0258  . traZODone (DESYREL) tablet 150 mg  150 mg Oral QHS Ursula Alert, MD        PTA Medications: Prescriptions Prior to Admission  Medication Sig Dispense Refill Last Dose  . ibuprofen (ADVIL,MOTRIN) 600 MG tablet Take 600 mg by mouth daily as needed.  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   Physician Treatment Plan for Primary Diagnosis: Major depressive disorder, single episode, severe without psychotic features ([place]) Long Term Goal(s): Improvement in symptoms so as ready for discharge  Short Term Goals: Ability to identify changes in lifestyle to reduce recurrence of condition will improve Ability to verbalize feelings will improve Compliance with prescribed medications will improve Ability to identify triggers associated with substance abuse/mental health issues will improve Ability to identify changes in lifestyle to reduce recurrence of condition will improve Ability to verbalize feelings will improve Compliance with prescribed medications will improve Ability to identify triggers associated with substance abuse/mental health issues will improve  Medication Management: Evaluate patient's response, side effects, and tolerance of medication regimen.  Therapeutic Interventions: 1 to 1 sessions, Unit Group sessions and Medication administration.  Evaluation of Outcomes: Adequate for Discharge  Physician Treatment Plan for Secondary Diagnosis: Principal Problem:   Major depressive disorder, single episode, severe without psychotic features ([place]) Active Problems:   Borderline personality disorder   Long Term Goal(s): Improvement in symptoms so as ready for discharge  Short Term Goals: Ability to identify changes in lifestyle to reduce recurrence of condition will improve Ability to verbalize feelings will improve Compliance  with prescribed medications will improve Ability to identify triggers associated with substance abuse/mental health issues will improve Ability to identify changes in lifestyle to reduce  recurrence of condition will improve Ability to verbalize feelings will improve Compliance with prescribed medications will improve Ability to identify triggers associated with substance abuse/mental health issues will improve  Medication Management: Evaluate patient's response, side effects, and tolerance of medication regimen.  Therapeutic Interventions: 1 to 1 sessions, Unit Group sessions and Medication administration.  Evaluation of Outcomes: Adequate for Discharge   RN Treatment Plan for Primary Diagnosis: Major depressive disorder, single episode, severe without psychotic features ([place]) Long Term Goal(s): Knowledge of disease and therapeutic regimen to maintain health will improve  Short Term Goals: Ability to disclose and discuss suicidal ideas and Ability to identify and develop effective coping behaviors will improve  Medication Management: RN will administer medications as ordered by provider, will assess and evaluate patient's response and provide education to patient for prescribed medication. RN will report any adverse and/or side effects to prescribing provider.  Therapeutic Interventions: 1 on 1 counseling sessions, Psychoeducation, Medication administration, Evaluate responses to treatment, Monitor vital signs and CBGs as ordered, Perform/monitor CIWA, COWS, AIMS and Fall Risk screenings as ordered, Perform wound care treatments as ordered.  Evaluation of Outcomes: Adequate for Discharge   LCSW Treatment Plan for Primary Diagnosis: Major depressive disorder, single episode, severe without psychotic features ([place]) Long Term Goal(s): Safe transition to appropriate next level of care at discharge, Engage patient in therapeutic group addressing interpersonal concerns.  Short Term Goals: Engage patient in aftercare planning with referrals and resources  Therapeutic Interventions: Assess for all discharge needs, 1 to 1 time with Social worker, Explore available resources and  support systems, Assess for adequacy in community support network, Educate family and significant other(s) on suicide prevention, Complete Psychosocial Assessment, Interpersonal group therapy.  Evaluation of Outcomes: Met  B[place]ar states he will find a place to stay when he leaves, Follow up at community mental health clinic   Progress in Treatment: Attending groups: Yes Participating in groups: Yes Taking medication as prescribed: Yes Toleration medication: Yes, no side effects reported at this time Family/Significant other contact made: No Patient understands diagnosis: Yes AEB asking for help with feeling suicidal Discussing patient identified problems/goals with staff: Yes Medical problems stabilized or resolved: Yes Denies suicidal/homicidal ideation: Yes Issues/concerns per patient self-inventory: None Other: N/A  New problem(s) identified: None identified at this time.   New Short Term/Long Term Goal(s): "I need a referral to a long term hospital, or a place to stay. I need help with feeling suicidal"  Discharge Plan or Barriers:   Reason for Continuation of Hospitalization: Depression Medication stabilization     Estimated Length of Stay: [place] d/c tomorrow, Wednesday at the latest  Attendees: Patient:  01/13/2017  8:23 AM  Physician: Ursula Alert, MD 01/13/2017  8:23 AM  Nursing: Alison Murray RN 01/13/2017  8:23 AM  RN Care Manager: Lars Pinks, RN 01/13/2017  8:23 AM  Social Worker: Ripley Fraise 01/13/2017  8:23 AM  Recreational Therapist: Winfield Cunas 01/13/2017  8:23 AM  Other: Norberto Sorenson 01/13/2017  8:23 AM  Other:  01/13/2017  8:23 AM    Scribe for Treatment Team:  Roque Lias LCSW 01/13/2017 8:23 AM

## 2017-01-13 NOTE — Progress Notes (Signed)
DATA ACTION RESPONSE  Objective-Pt. is visible in the dayroom, seen interacting with peers/staff.  Presents with an animated/anxiousaffect and mood.Pt is anxious about d/c possibly tomorrow. Subjective-Denies having any HI/SI at this time.+AVH stating "The spirits". Rates pain 6/10; Teeth. Pt states "I will be living with my mom until I can find a job". Pt cooperative and remain safe on the unit.  1:1 interaction in private to establish rapport. Encouragement, education, &support given from staff. PRN ibuprofen requested and will be re-eval.   Safety maintained with Q 15 checks. Continue with POC.

## 2017-01-14 MED ORDER — HYDROXYZINE HCL 25 MG PO TABS: 25.0000 mg | ORAL_TABLET | Freq: Four times a day (QID) | ORAL | 0 refills | Status: AC | PRN

## 2017-01-14 MED ORDER — TRAZODONE HCL 150 MG PO TABS: 150.0000 mg | ORAL_TABLET | Freq: Every day | ORAL | 0 refills | Status: AC

## 2017-01-14 MED ORDER — SERTRALINE HCL 50 MG PO TABS: 50.0000 mg | ORAL_TABLET | Freq: Every day | ORAL | 0 refills | Status: AC

## 2017-01-14 MED ORDER — AMOXICILLIN 500 MG PO CAPS: 500.0000 mg | ORAL_CAPSULE | Freq: Three times a day (TID) | ORAL | 0 refills | Status: AC

## 2017-01-14 NOTE — BHH Suicide Risk Assessment (Signed)
[facility] Discharge Suicide Risk Assessment   Principal Problem: Major depressive disorder, single episode, severe without psychotic features [facility]) Discharge Diagnoses:  Patient Active Problem List   Diagnosis Date Noted  . Major depressive disorder, single episode, severe without psychotic features (HCC) [F32.2] 01/08/2017    Priority: High  . Borderline personality disorder [F60.3] 01/08/2017    Total Time spent with patient: 30 minutes  Musculoskeletal: Strength & Muscle Tone: within normal limits Gait & Station: normal Patient leans: N/A  Psychiatric Specialty Exam: Review of Systems  Psychiatric/Behavioral: Positive for substance abuse. Negative for depression, hallucinations and suicidal ideas.  All other systems reviewed and are negative.   Blood pressure (!) 144/79, pulse 81, temperature 98.9 F (37.2 C), temperature source Oral, resp. rate 20, height 5' 4.5" (1.638 m), weight 97.5 kg (215 lb), SpO2 100 %.Body mass index is 36.33 kg/m.  General Appearance: Casual  Eye Contact::  Fair  Speech:  Normal Rate409  Volume:  Normal  Mood:  Euthymic  Affect:  Congruent  Thought Process:  Goal Directed and Descriptions of Associations: Intact  Orientation:  Full (Time, Place, and Person)  Thought Content:  Logical  Suicidal Thoughts:  No  Homicidal Thoughts:  No  Memory:  Immediate;   Fair Recent;   Fair Remote;   Fair  Judgement:  Fair  Insight:  Fair  Psychomotor Activity:  Normal  Concentration:  Fair  Recall:  [employer] of Knowledge:Fair  Language: Fair  Akathisia:  No  Handed:  Right  AIMS (if indicated):     Assets:  Communication Skills Desire for Improvement  Sleep:  Number of Hours: 6.25  Cognition: WNL  ADL's:  Intact   Mental Status Per Nursing Assessment::   On Admission:  Suicidal ideation indicated by patient, Suicide plan, Plan includes specific time, place, or method, Self-harm thoughts, Intention to act on suicide plan, Belief that plan would  result in death, Thoughts of violence towards others, Plan to harm others  Demographic Factors:  Male and Caucasian  Loss Factors: NA  Historical Factors: Impulsivity  Risk Reduction Factors:   Positive social support and Positive therapeutic relationship  Continued Clinical Symptoms:  Previous Psychiatric Diagnoses and Treatments  Cognitive Features That Contribute To Risk:  None    Suicide Risk:  Minimal: No identifiable suicidal ideation.  Patients presenting with no risk factors but with morbid ruminations; may be classified as minimal risk based on the severity of the depressive symptoms  Follow-up Information    [facility] Follow up on 01/16/2017.   Specialty:  Behavioral Health Why:  Hospital discharge follow up appointment on 8/30 at 9:15 AM.   Plan to spend 2 - 3 hours for assessments.  Bring photo ID, proof of income, hospital discharge paperwork.  Call to cancel/reschedule if needed.   Contact information: [address] [place] [address] [phone]           Plan Of Care/Follow-up recommendations:  Activity:  no restrictions Diet:  regular Tests:  as needed Other:  follow up with aftercare  Trinidy Masterson, MD 01/14/2017, 9:49 AM

## 2017-01-14 NOTE — Progress Notes (Signed)
Recreation Therapy Notes  Date: 01/14/17 Time: 1000 Location: 500 Hall Dayroom  Group Topic: Self-Esteem  Goal Area(s) Addresses:  Patient will successfully identify positive attributes about themselves.  Patient will successfully identify benefit of improved self-esteem.   Behavioral Response: Engaged  Intervention: Markers, colored pencils, blank crest  Activity: Crest of Arms.  Patients were given a blank crest divided into four sections.  Patients were to highlight things that were important to them or that had special meaning to them such as biggest accomplishment, proudest moment, favorite activity, something I'm good at, etc.  Education:  Self-Esteem, Building control surveyor.   Education Outcome: Acknowledges education/In group clarification offered/Needs additional education  Clinical Observations/Feedback: Pt stated his favorite color was purple, pt highlighted the color red for his love of Jesus, best feature is his eyes and his favorite activity is football.  Pt stated self esteem is important and "you can't change yourself for other people".    Caroll Rancher, LRT/CTRS        [name], Daiya Tamer A 01/14/2017 11:07 AM

## 2017-01-14 NOTE — Progress Notes (Signed)
Nursing Discharge Note 01/14/2017 [id number]  Data Reports sleeping good with PRN sleep med.  Rates depression 0/10, hopelessness 0/10, and anxiety 0/10. Affect wide ranged, mood euthymic.  Denies HI, SI.  Reports "seeing and hearing spirits, but that's always gonna be there.  If I don't fuck with them they don't fuck with me."  Polite and appropriate with staff.  Received discharge orders.  Action Spoke with patient 1:1, nurse offered support to patient throughout shift..  Reviewed medications, discharge instructions, and follow up appointments with patient. Medication samples and scripts reviewed and given to patient.  Paperwork, AVS, SRA, and transition record handed to patient.   Escorted off of unit at 1536. Belongings returned per belongings form.  Discharged to lobby where he was met by his mother.    Response Verbalized understanding of discharge teaching. Agrees to contact someone or 911 with thoughts/intent to harm self or others.    To follow up per AVS.

## 2017-01-14 NOTE — Progress Notes (Addendum)
  [facility] Adult Case Management Discharge Plan :  Will you be returning to the same living situation after discharge:  No. States meother will allow him to return home if he follows the rules At discharge, do you have transportation home?: Yes,  friend Do you have the ability to pay for your medications: Yes,  mental health  Release of information consent forms completed and in the chart;  Patient's signature needed at discharge.  Patient to Follow up at: Follow-up Information    [facility] Follow up on 01/16/2017.   Specialty:  Behavioral Health Why:  Hospital discharge follow up appointment on 8/30 at 9:15 AM.   Plan to spend 2 - 3 hours for assessments.  Bring photo ID, proof of income, hospital discharge paperwork.  Call to cancel/reschedule if needed.   Contact information: [address] [place] [address] [phone]           Next level of care provider has access to [facility] Link:no  Safety Planning and Suicide Prevention discussed: Yes,  yes  Have you used any form of tobacco in the last 30 days? (Cigarettes, Smokeless Tobacco, Cigars, and/or Pipes): Yes  Has patient been referred to the Quitline?: Patient refused referral  Patient has been referred for addiction treatment: Pt. refused referral  Ida Rogue, LCSW 01/14/2017, 10:31 AM
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
## 2017-02-23 ENCOUNTER — Encounter (HOSPITAL_COMMUNITY): Payer: Self-pay | Admitting: *Deleted

## 2017-02-23 ENCOUNTER — Emergency Department (HOSPITAL_COMMUNITY)
Admission: EM | Admit: 2017-02-23 | Discharge: 2017-02-23 | Disposition: A | Discharge status: Home / Self Care | Payer: Self-pay | Attending: Physician Assistant | Admitting: Physician Assistant

## 2017-02-23 DIAGNOSIS — R1110 Vomiting, unspecified: Secondary | ICD-10-CM | POA: Insufficient documentation

## 2017-02-23 DIAGNOSIS — Z79899 Other long term (current) drug therapy: Secondary | ICD-10-CM | POA: Insufficient documentation

## 2017-02-23 DIAGNOSIS — F17200 Nicotine dependence, unspecified, uncomplicated: Secondary | ICD-10-CM | POA: Insufficient documentation

## 2017-02-23 DIAGNOSIS — K2921 Alcoholic gastritis with bleeding: Secondary | ICD-10-CM | POA: Insufficient documentation

## 2017-02-23 HISTORY — DX: Diverticulitis of intestine, part unspecified, without perforation or abscess without bleeding: K57.92

## 2017-02-23 LAB — COMPREHENSIVE METABOLIC PANEL
ALBUMIN: 3.7 g/dL (ref 3.5–5.0)
ALK PHOS: 89 U/L (ref 38–126)
ALT: 22 U/L (ref 17–63)
ANION GAP: 11 (ref 5–15)
AST: 31 U/L (ref 15–41)
BUN: 11 mg/dL (ref 6–20)
CALCIUM: 8.8 mg/dL — AB (ref 8.9–10.3)
CO2: 22 mmol/L (ref 22–32)
Chloride: 105 mmol/L (ref 101–111)
Creatinine, Ser: 1.03 mg/dL (ref 0.61–1.24)
GFR calc Af Amer: >60 mL/min (ref >60)
GFR calc non Af Amer: >60 mL/min (ref >60)
GLUCOSE: 152 mg/dL — AB (ref 65–99)
Potassium: 4.2 mmol/L (ref 3.5–5.1)
SODIUM: 138 mmol/L (ref 135–145)
Total Bilirubin: 1 mg/dL (ref 0.3–1.2)
Total Protein: 6.4 g/dL — ABNORMAL LOW (ref 6.5–8.1)

## 2017-02-23 LAB — URINALYSIS, ROUTINE W REFLEX MICROSCOPIC
BILIRUBIN URINE: NEGATIVE
GLUCOSE, UA: NEGATIVE mg/dL
HGB URINE DIPSTICK: NEGATIVE
KETONES UR: NEGATIVE mg/dL
Leukocytes, UA: NEGATIVE
Nitrite: NEGATIVE
PROTEIN: NEGATIVE mg/dL
Specific Gravity, Urine: 1.026 (ref 1.005–1.030)
pH: 5 (ref 5.0–8.0)

## 2017-02-23 LAB — CBC
HEMATOCRIT: 43.4 % (ref 39.0–52.0)
HEMOGLOBIN: 14.5 g/dL (ref 13.0–17.0)
MCH: 30.2 pg (ref 26.0–34.0)
MCHC: 33.4 g/dL (ref 30.0–36.0)
MCV: 90.4 fL (ref 78.0–100.0)
PLATELETS: 275 10*3/uL (ref 150–400)
RBC: 4.8 MIL/uL (ref 4.22–5.81)
RDW: 13.6 % (ref 11.5–15.5)
WBC: 10.2 10*3/uL (ref 4.0–10.5)

## 2017-02-23 LAB — LIPASE, BLOOD: Lipase: 25 U/L (ref 11–51)

## 2017-02-23 MED ORDER — ONDANSETRON 4 MG PO TBDP: 4.0000 mg | ORAL_TABLET | Freq: Three times a day (TID) | ORAL | 0 refills | Status: AC | PRN

## 2017-02-23 MED ORDER — AMOXICILLIN-POT CLAVULANATE 875-125 MG PO TABS: 1.0000 | ORAL_TABLET | Freq: Two times a day (BID) | ORAL | 0 refills | Status: AC

## 2017-02-23 MED ORDER — OMEPRAZOLE 20 MG PO CPDR: 20.0000 mg | DELAYED_RELEASE_CAPSULE | Freq: Every day | ORAL | 0 refills | Status: AC

## 2017-02-23 MED ORDER — GI COCKTAIL ~~LOC~~
30.0000 mL | Freq: Once | ORAL | Status: AC
  Administered 2017-02-23: 30 mL via ORAL
  Filled 2017-02-23: qty 30

## 2017-02-23 MED ORDER — ONDANSETRON HCL 4 MG PO TABS
4.0000 mg | ORAL_TABLET | Freq: Once | ORAL | Status: AC
  Administered 2017-02-23: 4 mg via ORAL
  Filled 2017-02-23: qty 1

## 2017-02-23 NOTE — ED Triage Notes (Signed)
Pt reports not feeling well this am, had n/v and was vomiting blood. Also having recent LLQ pain. Reports similar symptoms in past when he had diverticulitis. No acute distress is noted at triage.

## 2017-02-23 NOTE — Discharge Instructions (Signed)
You did not want an IV or CAT scan today. We are going to treat you symptomatically. Please return if worsening, or any concerns.  We think this is likely gastritis will give you a PPI as well as nausea medicine.  If you've worsening left lower quadrant pain, place take anitbiotics provided.

## 2017-02-23 NOTE — ED Provider Notes (Signed)
[facility]-EMERGENCY DEPT Provider Note   CSN: [id number] Arrival date & time: 02/23/17  1905     History   Chief Complaint Chief Complaint  Patient presents with  . Abdominal Pain  . Hematemesis    HPI David Lamb is a 26 y.o. male.  HPI   26 year old male with borderline personality disorder presenting today with symptoms of vomiting 2. Patient vomited twice earlier this morning. He said that both times there was a little bit of black, red in it. He doesn't service on the eighth. Agent said that he also sometimes has pain in his left lower quadrant. Patient was diagnosed with "dietitis" and treated as an outpatient. Patient says this feels different. Patient has eaten since then, has been eating a lot of fast food. Patient endorses drinking more than usual.  Past Medical History:  Diagnosis Date  . Depression   . Diverticulitis   . Suicidal behavior     Patient Active Problem List   Diagnosis Date Noted  . Major depressive disorder, single episode, severe without psychotic features ([facility]) 01/08/2017  . Borderline personality disorder ([facility]) 01/08/2017    History reviewed. No pertinent surgical history.     Home Medications    Prior to Admission medications   Medication Sig Start Date End Date Taking? Authorizing Provider  amoxicillin (AMOXIL) 500 MG capsule Take 1 capsule (500 mg total) by mouth 3 (three) times daily with meals. For abscess tooth 01/14/17   Money, Gerlene Burdock, FNP  hydrOXYzine (ATARAX/VISTARIL) 25 MG tablet Take 1 tablet (25 mg total) by mouth every 6 (six) hours as needed for anxiety. 01/14/17   Money, Gerlene Burdock, FNP  ibuprofen (ADVIL,MOTRIN) 600 MG tablet Take 600 mg by mouth daily as needed. 12/08/16   [provider]  sertraline (ZOLOFT) 50 MG tablet Take 1 tablet (50 mg total) by mouth daily. For mood control 01/15/17   Money, Gerlene Burdock, FNP  traZODone (DESYREL) 150 MG tablet Take 1 tablet (150 mg total) by mouth at bedtime. For insomnia and mood  control 01/14/17   Money, Gerlene Burdock, FNP    Family History Family History  Problem Relation Age of Onset  . Mental illness Father   . Hypertension Father   . Diabetes Paternal Grandmother   . COPD Paternal Grandmother     Social History Social History  Substance Use Topics  . Smoking status: Current Some Day Smoker  . Smokeless tobacco: Never Used  . Alcohol use Yes     Allergies   Patient has no known allergies.   Review of Systems Review of Systems  Constitutional: Negative for activity change and fever.  Respiratory: Negative for shortness of breath.   Cardiovascular: Negative for chest pain.  Gastrointestinal: Positive for abdominal pain, nausea and vomiting.     Physical Exam Updated Vital Signs BP (!) 155/102   Pulse (!) 102   Temp 99.7 F (37.6 C) (Oral)   Resp 18   SpO2 96%   Physical Exam  Constitutional: He is oriented to person, place, and time. He appears well-nourished.  HENT:  Head: Normocephalic.  Eyes: Conjunctivae are normal.  Cardiovascular: Normal rate and regular rhythm.   No murmur heard. Pulmonary/Chest: Effort normal and breath sounds normal. No respiratory distress. He has no wheezes.  Abdominal: Soft. There is no tenderness.  Slightest touch of tenderness in the left lower quadrant.  Neurological: He is oriented to person, place, and time.  Skin: Skin is warm and dry. He is not diaphoretic.  Psychiatric:  He has a normal mood and affect. His behavior is normal.     ED Treatments / Results  Labs (all labs ordered are listed, but only abnormal results are displayed) Labs Reviewed  COMPREHENSIVE METABOLIC PANEL - Abnormal; Notable for the following:       Result Value   Glucose, Bld 152 (*)    Calcium 8.8 (*)    Total Protein 6.4 (*)    All other components within normal limits  LIPASE, BLOOD  CBC  URINALYSIS, ROUTINE W REFLEX MICROSCOPIC    EKG  EKG Interpretation None       Radiology No results  found.  Procedures Procedures (including critical care time)  Medications Ordered in ED Medications - No data to display   Initial Impression / Assessment and Plan / ED Course  I have reviewed the triage vital signs and the nursing notes.  Pertinent labs & imaging results that were available during my care of the patient were reviewed by me and considered in my medical decision making (see chart for details).    26 year old male with borderline personality disorder presenting today with symptoms of vomiting 2. Patient vomited twice earlier this morning. He said that both times there was a little bit of black, red in it. He doesn't service on the eighth. Agent said that he also sometimes has pain in his left lower quadrant. Patient was diagnosed with "dietitis" and treated as an outpatient. Patient says this feels different. Patient has eaten since then, has been eating a lot of fast food. Patient endorses drinking more than usual.  9:50 PM The fact that he only had 2 episodes of vomiting and this is over 13 hours ago, I do not think that is highly likely the patient has an acute GI bleed. This likely represents gastritis. Patient does not have any real tenderness on exam and is afebrile. I offered a CAT scan to patient however he said "I don't want that shit in my veins". In fact he does not want an IV either. I offered 2 courses of action 1. to treat with PPI and nausea medicine and follow up if owrse or 2. to get the IV and did a CAT scan. Patient chooses 1 and would like to return home with symptomatic care.  Final Clinical Impressions(s) / ED Diagnoses   Final diagnoses:  None    New Prescriptions New Prescriptions   No medications on file     Abelino Derrick, MD 02/23/17 2151

## 2017-02-23 NOTE — ED Notes (Signed)
Pt departed in NAD, refused use of wheelchair.
